# Patient Record
Sex: Female | Born: 1935 | Race: Black or African American | Hispanic: No | Marital: Married | Smoking: Never smoker
Health system: Southern US, Community
[De-identification: ages and names within clinical notes are randomized; demographics above are authoritative.]

## PROBLEM LIST (undated history)

## (undated) DIAGNOSIS — I150 Renovascular hypertension: Secondary | ICD-10-CM

## (undated) DIAGNOSIS — J309 Allergic rhinitis, unspecified: Secondary | ICD-10-CM

## (undated) DIAGNOSIS — F0280 Dementia in other diseases classified elsewhere without behavioral disturbance: Secondary | ICD-10-CM

## (undated) DIAGNOSIS — G309 Alzheimer's disease, unspecified: Secondary | ICD-10-CM

## (undated) DIAGNOSIS — N189 Chronic kidney disease, unspecified: Secondary | ICD-10-CM

## (undated) HISTORY — DX: Allergic rhinitis, unspecified: J30.9

## (undated) HISTORY — DX: Dementia in other diseases classified elsewhere without behavioral disturbance: F02.80

## (undated) HISTORY — DX: Alzheimer's disease, unspecified: G30.9

## (undated) HISTORY — DX: Chronic kidney disease, unspecified: N18.9

## (undated) HISTORY — DX: Renovascular hypertension: I15.0

---

## 1999-05-18 ENCOUNTER — Emergency Department (HOSPITAL_COMMUNITY): Admission: EM | Admit: 1999-05-18 | Discharge: 1999-05-18 | Payer: Self-pay | Admitting: Emergency Medicine

## 2000-01-12 ENCOUNTER — Other Ambulatory Visit: Admission: RE | Admit: 2000-01-12 | Discharge: 2000-01-12 | Payer: Self-pay | Admitting: Obstetrics

## 2000-01-15 ENCOUNTER — Encounter: Payer: Self-pay | Admitting: Cardiology

## 2000-01-15 ENCOUNTER — Ambulatory Visit (HOSPITAL_COMMUNITY): Admission: RE | Admit: 2000-01-15 | Discharge: 2000-01-15 | Payer: Self-pay | Admitting: Obstetrics

## 2012-09-02 ENCOUNTER — Non-Acute Institutional Stay (SKILLED_NURSING_FACILITY): Payer: Medicare Other | Admitting: Internal Medicine

## 2012-09-02 DIAGNOSIS — I10 Essential (primary) hypertension: Secondary | ICD-10-CM

## 2012-09-02 DIAGNOSIS — F0280 Dementia in other diseases classified elsewhere without behavioral disturbance: Secondary | ICD-10-CM

## 2012-09-02 DIAGNOSIS — N189 Chronic kidney disease, unspecified: Secondary | ICD-10-CM

## 2012-09-02 DIAGNOSIS — J309 Allergic rhinitis, unspecified: Secondary | ICD-10-CM

## 2012-09-14 NOTE — Progress Notes (Signed)
Patient ID: Sherry Savage, female   DOB: [birth year], 79 y.o.   MRN: [id number]        PROGRESS NOTE  DATE:   09/02/2012    FACILITY:  [facility]   LEVEL OF CARE: SNF  Routine Visit  CHIEF COMPLAINT:  Manage renal insufficiency, hypertension and dementia.   HISTORY OF PRESENT ILLNESS:  REASSESSMENT OF ONGOING PROBLEM(S):  CHRONIC KIDNEY DISEASE: The patient's chronic kidney disease remains stable.  Patient denies increasing lower extremity swelling or confusion. Last BUN and creatinine are:  On 08/22/2012, patient's BUN was 15, creatinine 1.33.  On 08/18/2012, BUN was 18, creatinine 1.37.    DEMENTIA: The dementia remains stable and continues to function adequately in the current living environment with supervision.  The patient has had little changes in behavior. No complications noted from the medications presently being used.   HTN: Pt 's HTN remains stable.  Denies CP, sob, DOE, pedal edema, headaches, dizziness or visual disturbances.  No complications from the medications currently being used.  Last BP : 136/70, 140/80, 142/80.  PAST MEDICAL HISTORY : Reviewed.  No changes.  CURRENT MEDICATIONS: Reviewed per [facility]  REVIEW OF SYSTEMS:  GENERAL: no change in appetite, no fatigue, no weight changes, no fever, chills or weakness RESPIRATORY: no cough, SOB, DOE, wheezing, hemoptysis CARDIAC: no chest pain, edema or palpitations GI: no abdominal pain, diarrhea, constipation, heart burn, nausea or vomiting  PHYSICAL EXAMINATION  GENERAL: no acute distress, normal body habitus EYES: conjunctivae normal, sclerae normal, normal eye lids NECK: supple, trachea midline, no neck masses, no thyroid tenderness, no thyromegaly LYMPHATICS: no LAN in the neck, no supraclavicular LAN RESPIRATORY: breathing is even & unlabored, BS CTAB CARDIAC: RRR, no murmur,no extra heart sounds, no edema GI: abdomen soft, normal BS, no masses, no tenderness, no hepatomegaly, no  splenomegaly PSYCHIATRIC: the patient is alert & oriented to person, affect & behavior appropriate  LABS/RADIOLOGY: 07/2012:  Creatinine 1.37, otherwise CMP normal.   06/2012:  CBC normal.  ASSESSMENT/PLAN:  Renal insufficiency.  Renal functions improved.   Alzheimer's dementia.  Stable.   Hypertension.  Stable.   Allergic rhinitis.  Well controlled.   CPT CODE: 82956

## 2012-09-30 ENCOUNTER — Non-Acute Institutional Stay (SKILLED_NURSING_FACILITY): Payer: Medicare Other | Admitting: Internal Medicine

## 2012-09-30 DIAGNOSIS — N189 Chronic kidney disease, unspecified: Secondary | ICD-10-CM

## 2012-09-30 DIAGNOSIS — I150 Renovascular hypertension: Secondary | ICD-10-CM

## 2012-09-30 DIAGNOSIS — G309 Alzheimer's disease, unspecified: Secondary | ICD-10-CM

## 2012-09-30 DIAGNOSIS — F0280 Dementia in other diseases classified elsewhere without behavioral disturbance: Secondary | ICD-10-CM

## 2012-09-30 DIAGNOSIS — J309 Allergic rhinitis, unspecified: Secondary | ICD-10-CM

## 2012-10-01 NOTE — Progress Notes (Signed)
Patient ID: Sherry Savage, female   DOB: [birth year], 79 y.o.   MRN: [id number]        PROGRESS NOTE  DATE:   09/30/2012    FACILITY:  [facility]   LEVEL OF CARE: SNF  Routine Visit  CHIEF COMPLAINT:  Manage renal insufficiency, hypertension and dementia.   HISTORY OF PRESENT ILLNESS:  REASSESSMENT OF ONGOING PROBLEM(S):  CHRONIC KIDNEY DISEASE: The patient's chronic kidney disease remains stable.  Patient denies increasing lower extremity swelling or confusion. Last BUN and creatinine are:  On 08/22/2012, patient's BUN was 15, creatinine 1.33.  On 08/18/2012, BUN was 18, creatinine 1.37.    DEMENTIA: The dementia remains stable and continues to function adequately in the current living environment with supervision.  The patient has had little changes in behavior. No complications noted from the medications presently being used.   HTN: Pt 's HTN remains stable.  Denies CP, sob, DOE, pedal edema, headaches, dizziness or visual disturbances.  No complications from the medications currently being used.  Last BP :140/86, 136/70, 140/80, 142/80.  PAST MEDICAL HISTORY : Reviewed.  No changes.  CURRENT MEDICATIONS: Reviewed per [facility]  REVIEW OF SYSTEMS:  GENERAL: no change in appetite, no fatigue, no weight changes, no fever, chills or weakness RESPIRATORY: no cough, SOB, DOE, wheezing, hemoptysis CARDIAC: no chest pain, edema or palpitations GI: no abdominal pain, diarrhea, constipation, heart burn, nausea or vomiting  PHYSICAL EXAMINATION  T97.4   P61   RR18   BP140/86    Wt(Lb) 152  GENERAL: no acute distress, normal body habitus NECK: supple, trachea midline, no neck masses, no thyroid tenderness, no thyromegaly RESPIRATORY: breathing is even & unlabored, BS CTAB CARDIAC: RRR, no murmur,no extra heart sounds, no edema GI: abdomen soft, normal BS, no masses, no tenderness, no hepatomegaly, no splenomegaly PSYCHIATRIC: the patient is alert & oriented to person, affect &  behavior appropriate  LABS/RADIOLOGY: 07/2012:  Creatinine 1.37, otherwise CMP normal.   06/2012:  CBC normal.  ASSESSMENT/PLAN:  Renal insufficiency.  Renal functions improved.   Alzheimer's dementia.  Stable.   Hypertension.  Stable.   Allergic rhinitis.  Well controlled.   CPT CODE: 40981

## 2012-11-02 ENCOUNTER — Non-Acute Institutional Stay (SKILLED_NURSING_FACILITY): Payer: Medicare Other | Admitting: Internal Medicine

## 2012-11-02 DIAGNOSIS — F0280 Dementia in other diseases classified elsewhere without behavioral disturbance: Secondary | ICD-10-CM

## 2012-11-02 DIAGNOSIS — I150 Renovascular hypertension: Secondary | ICD-10-CM

## 2012-11-02 DIAGNOSIS — N189 Chronic kidney disease, unspecified: Secondary | ICD-10-CM | POA: Insufficient documentation

## 2012-11-02 DIAGNOSIS — J309 Allergic rhinitis, unspecified: Secondary | ICD-10-CM

## 2012-11-02 DIAGNOSIS — G309 Alzheimer's disease, unspecified: Secondary | ICD-10-CM | POA: Insufficient documentation

## 2012-11-02 HISTORY — DX: Renovascular hypertension: I15.0

## 2012-11-02 HISTORY — DX: Allergic rhinitis, unspecified: J30.9

## 2012-11-02 HISTORY — DX: Chronic kidney disease, unspecified: N18.9

## 2012-11-02 HISTORY — DX: Dementia in other diseases classified elsewhere, unspecified severity, without behavioral disturbance, psychotic disturbance, mood disturbance, and anxiety: F02.80

## 2012-11-02 NOTE — Progress Notes (Signed)
Patient ID: Sherry Savage, female   DOB: 06-13-1915, 79 y.o.   MRN: [id number]        PROGRESS NOTE  DATE:   10/31/2012    FACILITY:  [facility]   LEVEL OF CARE: SNF  Routine Visit  CHIEF COMPLAINT:  Manage renal insufficiency, hypertension and dementia.   HISTORY OF PRESENT ILLNESS:  REASSESSMENT OF ONGOING PROBLEM(S):  CHRONIC KIDNEY DISEASE: The patient's chronic kidney disease remains stable.  Patient denies increasing lower extremity swelling or confusion. Last BUN and creatinine are:  On 08/22/2012, patient's BUN was 15, creatinine 1.33.  On 08/18/2012, BUN was 18, creatinine 1.37.    DEMENTIA: The dementia remains stable and continues to function adequately in the current living environment with supervision.  The patient has had little changes in behavior. No complications noted from the medications presently being used.   HTN: Pt 's HTN remains stable.  Denies CP, sob, DOE, pedal edema, headaches, dizziness or visual disturbances.  No complications from the medications currently being used.  Last BP :142/84,140/86, 136/70, 140/80, 142/80.  PAST MEDICAL HISTORY : Reviewed.  No changes.  CURRENT MEDICATIONS: Reviewed per [facility]  REVIEW OF SYSTEMS:  GENERAL: no change in appetite, no fatigue, no weight changes, no fever, chills or weakness RESPIRATORY: no cough, SOB, DOE, wheezing, hemoptysis CARDIAC: no chest pain, edema or palpitations GI: no abdominal pain, diarrhea, constipation, heart burn, nausea or vomiting  PHYSICAL EXAMINATION  T96  P68  RR18   BP140/86    Wt(Lb) 154  GENERAL: no acute distress, normal body habitus NECK: supple, trachea midline, no neck masses, no thyroid tenderness, no thyromegaly RESPIRATORY: breathing is even & unlabored, BS CTAB CARDIAC: RRR, no murmur,no extra heart sounds, no edema GI: abdomen soft, normal BS, no masses, no tenderness, no hepatomegaly, no splenomegaly PSYCHIATRIC: the patient is alert & oriented to person, affect &  behavior appropriate  LABS/RADIOLOGY: 5/14 liver profile normal 07/2012:  Creatinine 1.37, otherwise CMP normal.   06/2012:  CBC normal.  ASSESSMENT/PLAN:  Renal insufficiency.  Renal functions improved.   Alzheimer's dementia.  Stable.   Hypertension.  Stable.   Allergic rhinitis.  Well controlled.   CPT CODE: 40981

## 2012-12-08 ENCOUNTER — Non-Acute Institutional Stay (SKILLED_NURSING_FACILITY): Payer: Medicare Other | Admitting: Adult Health

## 2012-12-08 DIAGNOSIS — G309 Alzheimer's disease, unspecified: Secondary | ICD-10-CM

## 2012-12-08 DIAGNOSIS — F0280 Dementia in other diseases classified elsewhere without behavioral disturbance: Secondary | ICD-10-CM

## 2012-12-08 DIAGNOSIS — J309 Allergic rhinitis, unspecified: Secondary | ICD-10-CM

## 2012-12-08 DIAGNOSIS — I150 Renovascular hypertension: Secondary | ICD-10-CM

## 2013-01-04 ENCOUNTER — Non-Acute Institutional Stay (SKILLED_NURSING_FACILITY): Payer: Medicare Other | Admitting: Internal Medicine

## 2013-01-04 DIAGNOSIS — N189 Chronic kidney disease, unspecified: Secondary | ICD-10-CM

## 2013-01-04 DIAGNOSIS — F0280 Dementia in other diseases classified elsewhere without behavioral disturbance: Secondary | ICD-10-CM

## 2013-01-04 DIAGNOSIS — G309 Alzheimer's disease, unspecified: Secondary | ICD-10-CM

## 2013-01-04 DIAGNOSIS — I150 Renovascular hypertension: Secondary | ICD-10-CM

## 2013-01-04 DIAGNOSIS — J309 Allergic rhinitis, unspecified: Secondary | ICD-10-CM

## 2013-01-04 NOTE — Progress Notes (Signed)
Patient ID: Sherry Savage, female   DOB: 25-Dec-1915, 79 y.o.   MRN: [id number]        PROGRESS NOTE  DATE:   11/02/2012    FACILITY:  [facility]   LEVEL OF CARE: SNF  Routine Visit  CHIEF COMPLAINT:  Manage renal insufficiency, hypertension and dementia.   HISTORY OF PRESENT ILLNESS:  REASSESSMENT OF ONGOING PROBLEM(S):  CHRONIC KIDNEY DISEASE: The patient's chronic kidney disease remains stable.  Patient denies increasing lower extremity swelling or confusion. Last BUN and creatinine are:  On 08/22/2012, patient's BUN was 15, creatinine 1.33.  On 08/18/2012, BUN was 18, creatinine 1.37.    DEMENTIA: The dementia remains stable and continues to function adequately in the current living environment with supervision.  The patient has had little changes in behavior. No complications noted from the medications presently being used.   HTN: Pt 's HTN remains stable.  Denies CP, sob, DOE, pedal edema, headaches, dizziness or visual disturbances.  No complications from the medications currently being used.  Last BP :142/84,140/86, 136/70, 140/80, 142/80, 138/76  PAST MEDICAL HISTORY : Reviewed.  No changes.  CURRENT MEDICATIONS: Reviewed per [facility]  REVIEW OF SYSTEMS:  GENERAL: no change in appetite, no fatigue, no weight changes, no fever, chills or weakness RESPIRATORY: no cough, SOB, DOE, wheezing, hemoptysis CARDIAC: no chest pain, edema or palpitations GI: no abdominal pain, diarrhea, constipation, heart burn, nausea or vomiting  PHYSICAL EXAMINATION  T 98   P68   RR18   BP138/76    Wt(Lb) 158  GENERAL: no acute distress, normal body habitus NECK: supple, trachea midline, no neck masses, no thyroid tenderness, no thyromegaly RESPIRATORY: breathing is even & unlabored, BS CTAB CARDIAC: RRR, no murmur,no extra heart sounds, no edema GI: abdomen soft, normal BS, no masses, no tenderness, no hepatomegaly, no splenomegaly PSYCHIATRIC: the patient is alert & oriented to person,  affect & behavior appropriate  LABS/RADIOLOGY:  7/14 cbc nl 5/14 liver profile normal 07/2012:  Creatinine 1.37, otherwise CMP normal.   06/2012:  CBC normal.  ASSESSMENT/PLAN:  Renal insufficiency.  Renal functions improved.   Alzheimer's dementia.  Stable.   Hypertension.  Stable.   Allergic rhinitis.  Well controlled.   CPT CODE: 40981

## 2013-02-03 ENCOUNTER — Non-Acute Institutional Stay (SKILLED_NURSING_FACILITY): Payer: Medicare Other | Admitting: Internal Medicine

## 2013-02-03 DIAGNOSIS — J309 Allergic rhinitis, unspecified: Secondary | ICD-10-CM

## 2013-02-03 DIAGNOSIS — N189 Chronic kidney disease, unspecified: Secondary | ICD-10-CM

## 2013-02-03 DIAGNOSIS — F0280 Dementia in other diseases classified elsewhere without behavioral disturbance: Secondary | ICD-10-CM

## 2013-02-03 DIAGNOSIS — I150 Renovascular hypertension: Secondary | ICD-10-CM

## 2013-02-05 NOTE — Progress Notes (Signed)
Patient ID: Sherry Savage, female   DOB: 11-Jul-1915, 79 y.o.   MRN: [id number]        PROGRESS NOTE  DATE:   02/03/2013    FACILITY:  [facility]   LEVEL OF CARE: SNF  Routine Visit  CHIEF COMPLAINT:  Manage renal insufficiency, hypertension and dementia.   HISTORY OF PRESENT ILLNESS:  REASSESSMENT OF ONGOING PROBLEM(S):  CHRONIC KIDNEY DISEASE: The patient's chronic kidney disease remains stable.  Patient denies increasing lower extremity swelling or confusion. Last BUN and creatinine are:  On 08/22/2012, patient's BUN was 15, creatinine 1.33.  On 08/18/2012, BUN was 18, creatinine 1.37.    DEMENTIA: The dementia remains stable and continues to function adequately in the current living environment with supervision.  The patient has had little changes in behavior. No complications noted from the medications presently being used.   HTN: Pt 's HTN remains stable.  Denies CP, sob, DOE, pedal edema, headaches, dizziness or visual disturbances.  No complications from the medications currently being used.  Last BP :142/84,140/86, 136/70, 140/80, 142/80, 138/76, 120/68  PAST MEDICAL HISTORY : Reviewed.  No changes.  CURRENT MEDICATIONS: Reviewed per [facility]  REVIEW OF SYSTEMS:  GENERAL: no change in appetite, no fatigue, no weight changes, no fever, chills or weakness RESPIRATORY: no cough, SOB, DOE, wheezing, hemoptysis CARDIAC: no chest pain, edema or palpitations GI: no abdominal pain, diarrhea, constipation, heart burn, nausea or vomiting  PHYSICAL EXAMINATION  T 99   P82   RR20   BP120/68    Wt(Lb) 152  GENERAL: no acute distress, normal body habitus NECK: supple, trachea midline, no neck masses, no thyroid tenderness, no thyromegaly RESPIRATORY: breathing is even & unlabored, BS CTAB CARDIAC: RRR, no murmur,no extra heart sounds, no edema GI: abdomen soft, normal BS, no masses, no tenderness, no hepatomegaly, no splenomegaly PSYCHIATRIC: the patient is alert & oriented to  person, affect & behavior appropriate  LABS/RADIOLOGY:  7/14 cbc nl 5/14 liver profile normal 07/2012:  Creatinine 1.37, otherwise CMP normal.   06/2012:  CBC normal.  ASSESSMENT/PLAN:  Renal insufficiency.  Renal functions improved. Recheck  Alzheimer's dementia.  Stable.   Hypertension.  Stable.   Allergic rhinitis.  Well controlled.   Check CMP  CPT CODE: 78295

## 2013-02-10 ENCOUNTER — Non-Acute Institutional Stay (SKILLED_NURSING_FACILITY): Payer: Medicare Other | Admitting: Internal Medicine

## 2013-02-10 DIAGNOSIS — N189 Chronic kidney disease, unspecified: Secondary | ICD-10-CM

## 2013-02-11 NOTE — Progress Notes (Signed)
PROGRESS NOTE  DATE: 02/10/2013  FACILITY:  [facility]  LEVEL OF CARE: SNF (31)  Acute Visit  CHIEF COMPLAINT:  Manage CKD  HISTORY OF PRESENT ILLNESS: I was requested by the staff to assess the patient regarding above problem(s):  CHRONIC KIDNEY DISEASE: The patient's chronic kidney disease remains stable.  Patient denies increasing lower extremity swelling or confusion. Last BUN and creatinine are: 19/1.28 on 02/08/13.  In 3/14 Cr 1.33.  PAST MEDICAL HISTORY : Reviewed.  No changes.  CURRENT MEDICATIONS: Reviewed per [facility]  PHYSICAL EXAMINATION  GENERAL: no acute distress, normal body habitus RESPIRATORY: breathing is even & unlabored, BS CTAB CARDIAC: RRR, no murmur,no extra heart sounds, no edema  LABS/RADIOLOGY: see HPI  ASSESSMENT/PLAN:  CKD- renal fcns improved.  CPT CODE: 16109

## 2013-03-09 ENCOUNTER — Non-Acute Institutional Stay (SKILLED_NURSING_FACILITY): Payer: Medicare Other | Admitting: Internal Medicine

## 2013-03-09 DIAGNOSIS — N189 Chronic kidney disease, unspecified: Secondary | ICD-10-CM

## 2013-03-09 DIAGNOSIS — F0280 Dementia in other diseases classified elsewhere without behavioral disturbance: Secondary | ICD-10-CM

## 2013-03-09 DIAGNOSIS — I150 Renovascular hypertension: Secondary | ICD-10-CM

## 2013-03-09 DIAGNOSIS — J309 Allergic rhinitis, unspecified: Secondary | ICD-10-CM

## 2013-03-09 NOTE — Progress Notes (Signed)
Patient ID: Sherry Savage, female   DOB: August 07, 1915, 79 y.o.   MRN: [id number]        PROGRESS NOTE  DATE:   03/09/2013    [facility]ILITY[facility]  [facility]   LEVEL OF CARE: SNF  Routine Visit  CHIEF COMPLAINT:  Manage renal insufficiency, hypertension and dementia.   HISTORY OF PRESENT ILLNESS:  REASSESSMENT OF ONGOING PROBLEM(S):  CHRONIC KIDNEY DISEASE: The patient's chronic kidney disease remains stable.  Patient denies increasing lower extremity swelling or confusion. Last BUN and creatinine are:  On 08/22/2012, patient's BUN was 15, creatinine 1.33.  On 08/18/2012, BUN was 18, creatinine 1.37, in 9/14 bun 19, cr 1.28.    DEMENTIA: The dementia remains stable and continues to function adequately in the current living environment with supervision.  The patient has had little changes in behavior. No complications noted from the medications presently being used.   HTN: Pt 's HTN remains stable.  Denies CP, sob, DOE, pedal edema, headaches, dizziness or visual disturbances.  No complications from the medications currently being used.  Last BP :142/84,140/86, 136/70, 140/80, 142/80, 138/76, 120/68, 122/62  PAST MEDICAL HISTORY : Reviewed.  No changes.  CURRENT MEDICATIONS: Reviewed per [facility]  REVIEW OF SYSTEMS:  GENERAL: no change in appetite, no fatigue, no weight changes, no fever, chills or weakness RESPIRATORY: no cough, SOB, DOE, wheezing, hemoptysis CARDIAC: no chest pain, edema or palpitations GI: no abdominal pain, diarrhea, constipation, heart burn, nausea or vomiting  PHYSICAL EXAMINATION  T 97.8   P72   RR18   BP122/62   Wt(Lb) 152  GENERAL: no acute distress, normal body habitus NECK: supple, trachea midline, no neck masses, no thyroid tenderness, no thyromegaly RESPIRATORY: breathing is even & unlabored, BS CTAB CARDIAC: RRR, no murmur,no extra heart sounds, no edema GI: abdomen soft, normal BS, no masses, no tenderness, no hepatomegaly, no splenomegaly PSYCHIATRIC: the  patient is alert & oriented to person, affect & behavior appropriate  LABS/RADIOLOGY:  9/14 cr 1.28 ow cmp nl  7/14 cbc nl 5/14 liver profile normal 07/2012:  Creatinine 1.37, otherwise CMP normal.   06/2012:  CBC normal.  ASSESSMENT/PLAN:  Renal insufficiency.  Renal functions improved.   Alzheimer's dementia.  Stable.   Hypertension.  Stable.   Allergic rhinitis.  Well controlled.   CPT CODE: 01027

## 2013-04-06 ENCOUNTER — Non-Acute Institutional Stay (SKILLED_NURSING_FACILITY): Payer: Medicare Other | Admitting: Internal Medicine

## 2013-04-06 DIAGNOSIS — J309 Allergic rhinitis, unspecified: Secondary | ICD-10-CM

## 2013-04-06 DIAGNOSIS — F0280 Dementia in other diseases classified elsewhere without behavioral disturbance: Secondary | ICD-10-CM

## 2013-04-06 DIAGNOSIS — I150 Renovascular hypertension: Secondary | ICD-10-CM

## 2013-04-06 DIAGNOSIS — N189 Chronic kidney disease, unspecified: Secondary | ICD-10-CM

## 2013-04-07 NOTE — Progress Notes (Signed)
Patient ID: Sherry Savage, female   DOB: [birth year], 79 y.o.   MRN: [id number]        PROGRESS NOTE  DATE:   04/06/2013    [facility]ACILITY[facility]  [facility]   LEVEL OF CARE: SNF  Routine Visit  CHIEF COMPLAINT:  Manage renal insufficiency, hypertension and dementia.   HISTORY OF PRESENT ILLNESS:  REASSESSMENT OF ONGOING PROBLEM(S):  CHRONIC KIDNEY DISEASE: The patient's chronic kidney disease remains stable.  Patient denies increasing lower extremity swelling or confusion. Last BUN and creatinine are:  On 08/22/2012, patient's BUN was 15, creatinine 1.33.  On 08/18/2012, BUN was 18, creatinine 1.37, in 9/14 bun 19, cr 1.28.    DEMENTIA: The dementia remains stable and continues to function adequately in the current living environment with supervision.  The patient has had little changes in behavior. No complications noted from the medications presently being used.   HTN: Pt 's HTN remains stable.  Denies CP, sob, DOE, pedal edema, headaches, dizziness or visual disturbances.  No complications from the medications currently being used.  Last BP :142/84,140/86, 136/70, 140/80, 142/80, 138/76, 120/68, 122/62, 124/64  PAST MEDICAL HISTORY : Reviewed.  No changes.  CURRENT MEDICATIONS: Reviewed per [facility]  REVIEW OF SYSTEMS:  GENERAL: no change in appetite, no fatigue, no weight changes, no fever, chills or weakness RESPIRATORY: no cough, SOB, DOE, wheezing, hemoptysis CARDIAC: no chest pain, edema or palpitations GI: no abdominal pain, diarrhea, constipation, heart burn, nausea or vomiting  PHYSICAL EXAMINATION  T 97.6   P74   RR18   BP124/64   Wt(Lb) 152  GENERAL: no acute distress, normal body habitus NECK: supple, trachea midline, no neck masses, no thyroid tenderness, no thyromegaly RESPIRATORY: breathing is even & unlabored, BS CTAB CARDIAC: RRR, no murmur,no extra heart sounds, no edema GI: abdomen soft, normal BS, no masses, no tenderness, no hepatomegaly, no  splenomegaly PSYCHIATRIC: the patient is alert & oriented to person, affect & behavior appropriate  LABS/RADIOLOGY:  9/14 cr 1.28 ow cmp nl  7/14 cbc nl 5/14 liver profile normal 07/2012:  Creatinine 1.37, otherwise CMP normal.   06/2012:  CBC normal.  ASSESSMENT/PLAN:  Renal insufficiency.  Renal functions improved.   Alzheimer's dementia.  Stable.   Hypertension.  Stable.   Allergic rhinitis.  Well controlled.   CPT CODE: 40981

## 2013-04-19 ENCOUNTER — Other Ambulatory Visit: Payer: Self-pay | Admitting: *Deleted

## 2013-04-19 MED ORDER — LORAZEPAM 0.5 MG PO TABS: ORAL_TABLET | ORAL | Status: DC

## 2013-05-04 ENCOUNTER — Non-Acute Institutional Stay (SKILLED_NURSING_FACILITY): Payer: Medicare Other | Admitting: Internal Medicine

## 2013-05-04 DIAGNOSIS — N189 Chronic kidney disease, unspecified: Secondary | ICD-10-CM

## 2013-05-04 DIAGNOSIS — J309 Allergic rhinitis, unspecified: Secondary | ICD-10-CM

## 2013-05-04 DIAGNOSIS — F0280 Dementia in other diseases classified elsewhere without behavioral disturbance: Secondary | ICD-10-CM

## 2013-05-04 DIAGNOSIS — I150 Renovascular hypertension: Secondary | ICD-10-CM

## 2013-05-05 ENCOUNTER — Encounter: Payer: Self-pay | Admitting: Internal Medicine

## 2013-05-05 NOTE — Progress Notes (Signed)
Patient ID: Sherry Savage, female   DOB: 10-19-1915, 79 y.o.   MRN: [id number]        PROGRESS NOTE  DATE:   05/04/2013    FACILITY[facility]  [facility]   LEVEL OF CARE: SNF  Routine Visit  CHIEF COMPLAINT:  Manage renal insufficiency, hypertension and dementia.   HISTORY OF PRESENT ILLNESS:  REASSESSMENT OF ONGOING PROBLEM(S):  CHRONIC KIDNEY DISEASE: The patient's chronic kidney disease remains stable.  Patient denies increasing lower extremity swelling or confusion. Last BUN and creatinine are:  On 08/22/2012, patient's BUN was 15, creatinine 1.33.  On 08/18/2012, BUN was 18, creatinine 1.37, in 9/14 bun 19, cr 1.28.    DEMENTIA: The dementia remains stable and continues to function adequately in the current living environment with supervision.  The patient has had little changes in behavior. No complications noted from the medications presently being used.   HTN: Pt 's HTN remains stable.  Denies CP, sob, DOE, pedal edema, headaches, dizziness or visual disturbances.  No complications from the medications currently being used.  Last BP :142/84,140/86, 136/70, 140/80, 142/80, 138/76, 120/68, 122/62, 124/64  PAST MEDICAL HISTORY : Reviewed.  No changes.  CURRENT MEDICATIONS: Reviewed per [facility]  REVIEW OF SYSTEMS:  GENERAL: no change in appetite, no fatigue, no weight changes, no fever, chills or weakness RESPIRATORY: no cough, SOB, DOE, wheezing, hemoptysis CARDIAC: no chest pain, edema or palpitations GI: no abdominal pain, diarrhea, constipation, heart burn, nausea or vomiting  PHYSICAL EXAMINATION  T 97.6   P74   RR18   BP124/64   Wt(Lb) 152  GENERAL: no acute distress, normal body habitus NECK: supple, trachea midline, no neck masses, no thyroid tenderness, no thyromegaly RESPIRATORY: breathing is even & unlabored, BS CTAB CARDIAC: RRR, no murmur,no extra heart sounds, no edema GI: abdomen soft, normal BS, no masses, no tenderness, no hepatomegaly, no  splenomegaly PSYCHIATRIC: the patient is alert & oriented to person, affect & behavior appropriate  LABS/RADIOLOGY:  9/14 cr 1.28 ow cmp nl  7/14 cbc nl 5/14 liver profile normal 07/2012:  Creatinine 1.37, otherwise CMP normal.   06/2012:  CBC normal.  ASSESSMENT/PLAN:  Renal insufficiency.  Renal functions improved.   Alzheimer's dementia.  Stable.   Hypertension.  Stable.   Allergic rhinitis.  Well controlled.   Anxiety-Ativan was started.  CPT CODE: 40981

## 2013-05-09 ENCOUNTER — Encounter: Payer: Self-pay | Admitting: Adult Health

## 2013-05-09 NOTE — Progress Notes (Signed)
Patient ID: Sherry Savage, female   DOB: 04-07-1916, 79 y.o.   MRN: [id number]     [place]  Allergies no known allergies  Chief Complaint  Patient presents with  . Medical Managment of Chronic Issues    HPI She is being seen for the management of her chronic illnesses. Overall she has not had a significant change in her recent status. There are no concerns being voiced by the nursing staff at this time. She cannot fully participate in the hpi or ros.   Past Medical History  Diagnosis Date  . 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 Alzheimer's disease 11/02/2012  . Allergic rhinitis, cause unspecified 11/02/2012  . Chronic kidney disease, unspecified 11/02/2012    No past surgical history on file.  Filed Vitals:   12/08/12 1308  BP: 132/72  Pulse: 64  Height: 5' (1.524 m)  Weight: 158 lb (71.668 kg)    MEDICATIONS Asa 81 mg daily Cozaar 100 mg daily claritin 10 mg daily aricept 10 mg daily Clonidine 0.1 mg bid   LABS REVIEWED:   08-10-12: glucose 79; bun 18; creat 1.37; k+4.4; na++ 145; liver normal albumin 3.7  10-19-12: liver normal albumin 3.9  Review of Systems  Unable to perform ROS   Physical Exam  Constitutional: She appears well-developed. No distress.  Neck: Neck supple. No JVD present.  Cardiovascular: Normal rate, regular rhythm and intact distal pulses.   Respiratory: Effort normal and breath sounds normal. No respiratory distress.  GI: Soft. Bowel sounds are normal. She exhibits no distension. There is no tenderness.  Musculoskeletal: She exhibits no edema.  Is able to move extremities   Neurological: She is alert.  Skin: Skin is warm and dry. She is not diaphoretic.     ASSESSMENT/PLAN  1. Alzheimer's disease: she is without change in status; will continue her aricept 10 mg daily and will continue to monitor her status.   2. Hypertension: is stable will continue cozaar 100 mg daily and clonidine 0.1 mg twice daily and asa 81  mg daily   3. Allergic rhinitis: will continue claritin 10 mg daily

## 2013-06-08 ENCOUNTER — Non-Acute Institutional Stay (SKILLED_NURSING_FACILITY): Payer: Medicare Other | Admitting: Internal Medicine

## 2013-06-08 DIAGNOSIS — F0280 Dementia in other diseases classified elsewhere without behavioral disturbance: Secondary | ICD-10-CM

## 2013-06-08 DIAGNOSIS — N189 Chronic kidney disease, unspecified: Secondary | ICD-10-CM

## 2013-06-08 DIAGNOSIS — G309 Alzheimer's disease, unspecified: Secondary | ICD-10-CM

## 2013-06-08 DIAGNOSIS — J309 Allergic rhinitis, unspecified: Secondary | ICD-10-CM

## 2013-06-08 DIAGNOSIS — I150 Renovascular hypertension: Secondary | ICD-10-CM

## 2013-06-09 NOTE — Progress Notes (Signed)
Patient ID: Sherry LeschesHattie Savage, female   DOB: Feb 06, 1916, [age] y.o.   MRN: [id number]          PROGRESS NOTE  DATE: 06-08-13    FACILITY: [facility]   LEVEL OF CARE: SNF  Routine Visit  CHIEF COMPLAINT:  Manage renal insufficiency, hypertension and dementia.   HISTORY OF PRESENT ILLNESS:  REASSESSMENT OF ONGOING PROBLEM(S):  CHRONIC KIDNEY DISEASE: The patient's chronic kidney disease remains stable.  Patient denies increasing lower extremity swelling or confusion. Last BUN and creatinine are:  On 08/22/2012, patient's BUN was 15, creatinine 1.33.  On 08/18/2012, BUN was 18, creatinine 1.37, in 9/14 bun 19, cr 1.28.    DEMENTIA: The dementia remains stable and continues to function adequately in the current living environment with supervision.  The patient has had little changes in behavior. No complications noted from the medications presently being used.   HTN: Pt 's HTN remains stable.  Denies CP, sob, DOE, pedal edema, headaches, dizziness or visual disturbances.  No complications from the medications currently being used.  Last BP :142/84,140/86, 136/70, 140/80, 142/80, 138/76, 120/68, 122/62, 124/64, 110/70  PAST MEDICAL HISTORY : Reviewed.  No changes.  [facility]  REVIEW OF SYSTEMS:  GENERAL: no change in appetite, no fatigue, no weight changes, no fever, chills or weakness RESPIRATORY: no cough, SOB, DOE, wheezing, hemoptysis CARDIAC: no chest pain, edema or palpitations GI: no abdominal pain, diarrhea, constipation, heart burn, nausea or vomiting  PHYSICAL EXAMINATION  T 97.2     P68      RR 20      BP 110/70    Wt(Lb) 155  GENERAL: no acute distress, normal body habitus NECK: supple, trachea midline, no neck masses, no thyroid tenderness, no thyromegaly RESPIRATORY: breathing is even & unlabored, BS CTAB CARDIAC: RRR, no murmur,no extra heart sounds, no edema GI: abdomen soft, normal BS, no masses, no tenderness, no hepatomegaly, no  splenomegaly PSYCHIATRIC: the patient is alert & oriented to person, affect & behavior appropriate  LABS/RADIOLOGY: 11-14 liver profile normal  9/14 cr 1.28 ow cmp nl  7/14 cbc nl 5/14 liver profile normal 07/2012:  Creatinine 1.37, otherwise CMP normal.   06/2012:  CBC normal.  ASSESSMENT/PLAN:  Renal insufficiency.  Renal functions improved.   Alzheimer's dementia.  Stable.   Hypertension.  Stable.   Allergic rhinitis.  Well controlled.   Anxiety-Ativan was started.  Check CBC  CPT CODE: [id number]

## 2013-07-06 ENCOUNTER — Non-Acute Institutional Stay (SKILLED_NURSING_FACILITY): Payer: Medicare Other | Admitting: Internal Medicine

## 2013-07-06 DIAGNOSIS — F0280 Dementia in other diseases classified elsewhere without behavioral disturbance: Secondary | ICD-10-CM

## 2013-07-06 DIAGNOSIS — J309 Allergic rhinitis, unspecified: Secondary | ICD-10-CM

## 2013-07-06 DIAGNOSIS — N189 Chronic kidney disease, unspecified: Secondary | ICD-10-CM

## 2013-07-06 DIAGNOSIS — G309 Alzheimer's disease, unspecified: Secondary | ICD-10-CM

## 2013-07-06 DIAGNOSIS — I150 Renovascular hypertension: Secondary | ICD-10-CM

## 2013-07-07 NOTE — Progress Notes (Signed)
Patient ID: Sherry LeschesHattie Spangle, female   DOB: June 29, 1915, 80 y.o.   MRN: [id number]           PROGRESS NOTE  DATE: 07-06-13    FACILITY: [facility]   LEVEL OF CARE: SNF  Routine Visit  CHIEF COMPLAINT:  Manage renal insufficiency, hypertension and dementia.   HISTORY OF PRESENT ILLNESS:  REASSESSMENT OF ONGOING PROBLEM(S):  CHRONIC KIDNEY DISEASE: The patient's chronic kidney disease remains stable.  Patient denies increasing lower extremity swelling or confusion. Last BUN and creatinine are:  On 08/22/2012, patient's BUN was 15, creatinine 1.33.  On 08/18/2012, BUN was 18, creatinine 1.37, in 9/14 bun 19, cr 1.28.    DEMENTIA: The dementia remains stable and continues to function adequately in the current living environment with supervision.  The patient has had little changes in behavior. No complications noted from the medications presently being used.   HTN: Pt 's HTN remains stable.  Denies CP, sob, DOE, pedal edema, headaches, dizziness or visual disturbances.  No complications from the medications currently being used.  Last BP :142/84,140/86, 136/70, 140/80, 142/80, 138/76, 120/68, 122/62, 124/64, 110/70, 125/87  PAST MEDICAL HISTORY : Reviewed.  No changes.  CURRENT MEDICATIONS: Reviewed per [facility]  REVIEW OF SYSTEMS:  GENERAL: no change in appetite, no fatigue, no weight changes, no fever, chills or weakness RESPIRATORY: no cough, SOB, DOE, wheezing, hemoptysis CARDIAC: no chest pain, edema or palpitations GI: no abdominal pain, diarrhea, constipation, heart burn, nausea or vomiting  PHYSICAL EXAMINATION  T 97.8     P 76      RR 20      BP 125/87    Wt(Lb) 155  GENERAL: no acute distress, normal body habitus NECK: supple, trachea midline, no neck masses, no thyroid tenderness, no thyromegaly RESPIRATORY: breathing is even & unlabored, BS CTAB CARDIAC: RRR, no murmur,no extra heart sounds, no edema GI: abdomen soft, normal BS, no masses, no tenderness, no hepatomegaly,  no splenomegaly PSYCHIATRIC: the patient is alert & oriented to person, affect & behavior appropriate  LABS/RADIOLOGY: 1-15 CBC normal 11-14 liver profile normal  9/14 cr 1.28 ow cmp nl  7/14 cbc nl 5/14 liver profile normal 07/2012:  Creatinine 1.37, otherwise CMP normal.   06/2012:  CBC normal.  ASSESSMENT/PLAN:  Renal insufficiency.  Renal functions improved.   Alzheimer's dementia.  Stable.   Hypertension.  Stable.   Allergic rhinitis.  Well controlled.   Anxiety-Ativan was started.  CPT CODE: 0272599308

## 2013-09-13 ENCOUNTER — Other Ambulatory Visit: Payer: Self-pay | Admitting: *Deleted

## 2013-09-13 MED ORDER — LORAZEPAM 0.5 MG PO TABS: ORAL_TABLET | ORAL | Status: DC

## 2013-09-13 NOTE — Telephone Encounter (Signed)
[facility] 

## 2013-10-23 ENCOUNTER — Non-Acute Institutional Stay (SKILLED_NURSING_FACILITY): Payer: Medicare Other | Admitting: Internal Medicine

## 2013-10-23 DIAGNOSIS — N189 Chronic kidney disease, unspecified: Secondary | ICD-10-CM

## 2013-10-23 DIAGNOSIS — I150 Renovascular hypertension: Secondary | ICD-10-CM

## 2013-10-23 DIAGNOSIS — J309 Allergic rhinitis, unspecified: Secondary | ICD-10-CM

## 2013-10-23 DIAGNOSIS — G309 Alzheimer's disease, unspecified: Principal | ICD-10-CM

## 2013-10-23 DIAGNOSIS — F0280 Dementia in other diseases classified elsewhere without behavioral disturbance: Secondary | ICD-10-CM

## 2013-10-23 NOTE — Progress Notes (Signed)
Patient ID: Sherry Savage, female   DOB: 1915/11/18, 80 y.o.   MRN: [id number]            PROGRESS NOTE  DATE: 10-23-13    FACILITY: [facility]   LEVEL OF CARE: SNF  Routine Visit  CHIEF COMPLAINT:  Manage renal insufficiency, hypertension and dementia.   HISTORY OF PRESENT ILLNESS:  REASSESSMENT OF ONGOING PROBLEM(S):  CHRONIC KIDNEY DISEASE: The patient's chronic kidney disease remains stable.  Patient denies increasing lower extremity swelling or confusion. Last BUN and creatinine are:  On 08/22/2012, patient's BUN was 15, creatinine 1.33.  On 08/18/2012, BUN was 18, creatinine 1.37, in 9/14 bun 19, cr 1.28, in 3-15 bun 15, cr 1.34.Marland Kitchen    DEMENTIA: The dementia remains stable and continues to function adequately in the current living environment with supervision.  The patient has had little changes in behavior. No complications noted from the medications presently being used.   HTN: Pt 's HTN remains stable.  Denies CP, sob, DOE, pedal edema, headaches, dizziness or visual disturbances.  No complications from the medications currently being used.  Last BP :142/84,140/86, 136/70, 140/80, 142/80, 138/76, 120/68, 122/62, 124/64, 110/70, 125/87, 115/60.  PAST MEDICAL HISTORY : Reviewed.  No changes.  CURRENT MEDICATIONS: Reviewed per [facility]  REVIEW OF SYSTEMS:  GENERAL: no change in appetite, no fatigue, no weight changes, no fever, chills or weakness RESPIRATORY: no cough, SOB, DOE, wheezing, hemoptysis CARDIAC: no chest pain, edema or palpitations GI: no abdominal pain, diarrhea, constipation, heart burn, nausea or vomiting  PHYSICAL EXAMINATION  VS: see VS section  GENERAL: no acute distress, normal body habitus EYES: Normal sclerae, normal conjunctivae, no discharge NECK: supple, trachea midline, no neck masses, no thyroid tenderness, no thyromegaly LYMPHATICS: no cervical LAN, no supraclavicular LAN RESPIRATORY: breathing is even & unlabored, BS CTAB CARDIAC: RRR, no  murmur,no extra heart sounds, no edema GI: abdomen soft, normal BS, no masses, no tenderness, no hepatomegaly, no splenomegaly PSYCHIATRIC: the patient is alert & oriented to person, affect & behavior appropriate  LABS/RADIOLOGY:  3-15 cr 1.34, K 5.7 ow cmp nl 1-15 CBC normal 11-14 liver profile normal  9/14 cr 1.28 ow cmp nl  7/14 cbc nl 5/14 liver profile normal 07/2012:  Creatinine 1.37, otherwise CMP normal.   06/2012:  CBC normal.  ASSESSMENT/PLAN:  Renal insufficiency. Stable.  Alzheimer's dementia.  Stable.   Hypertension.  Stable.   Allergic rhinitis.  Well controlled.   Anxiety-stable.  Hyperkalemia-recheck.  CPT CODE: 09983  Newton Pigg. Kerry Dory, MD [facility] [phone]

## 2013-11-13 ENCOUNTER — Non-Acute Institutional Stay (SKILLED_NURSING_FACILITY): Payer: Medicare Other | Admitting: Internal Medicine

## 2013-11-13 DIAGNOSIS — I150 Renovascular hypertension: Secondary | ICD-10-CM

## 2013-11-13 DIAGNOSIS — F0280 Dementia in other diseases classified elsewhere without behavioral disturbance: Secondary | ICD-10-CM

## 2013-11-13 DIAGNOSIS — G309 Alzheimer's disease, unspecified: Secondary | ICD-10-CM

## 2013-11-13 DIAGNOSIS — J309 Allergic rhinitis, unspecified: Secondary | ICD-10-CM

## 2013-11-13 DIAGNOSIS — N189 Chronic kidney disease, unspecified: Secondary | ICD-10-CM

## 2013-11-13 NOTE — Progress Notes (Signed)
Patient ID: Sherry LeschesHattie Tippy, female   DOB: [birth year], 80 y.o.   MRN: [id number]          PROGRESS NOTE  DATE: 11-13-13    FACILITY: [facility]   LEVEL OF CARE: SNF  Routine Visit  CHIEF COMPLAINT:  Manage renal insufficiency, hypertension and dementia.   HISTORY OF PRESENT ILLNESS:  REASSESSMENT OF ONGOING PROBLEM(S):  CHRONIC KIDNEY DISEASE: The patient's chronic kidney disease remains stable.  Patient denies increasing lower extremity swelling or confusion. Last BUN and creatinine are:  On 08/22/2012, patient's BUN was 15, creatinine 1.33.  On 08/18/2012, BUN was 18, creatinine 1.37, in 9/14 bun 19, cr 1.28, in 3-15 bun 15, cr 1.34.Marland Kitchen.    DEMENTIA: The dementia remains stable and continues to function adequately in the current living environment with supervision.  The patient has had little changes in behavior. No complications noted from the medications presently being used.   HTN: Pt 's HTN remains stable.  Denies CP, sob, DOE, pedal edema, headaches, dizziness or visual disturbances.  No complications from the medications currently being used.  Last BP :142/84,140/86, 136/70, 140/80, 142/80, 138/76, 120/68, 122/62, 124/64, 110/70, 125/87, 115/60, 164/81.  PAST MEDICAL HISTORY : Reviewed.  No changes.  CURRENT MEDICATIONS: [facility]  REVIEW OF SYSTEMS:  GENERAL: no change in appetite, no fatigue, no weight changes, no fever, chills or weakness RESPIRATORY: no cough, SOB, DOE, wheezing, hemoptysis CARDIAC: no chest pain, edema or palpitations GI: no abdominal pain, diarrhea, constipation, heart burn, nausea or vomiting  PHYSICAL EXAMINATION  VS: see VS section  GENERAL: no acute distress, normal body habitus NECK: supple, trachea midline, no neck masses, no thyroid tenderness, no thyromegaly RESPIRATORY: breathing is even & unlabored, BS CTAB CARDIAC: RRR, no murmur,no extra heart sounds, no edema GI: abdomen soft, normal BS, no masses, no tenderness, no  hepatomegaly, no splenomegaly PSYCHIATRIC: the patient is alert & oriented to person, affect & behavior appropriate  LABS/RADIOLOGY: 5-15 potassium 4.8 3-15 cr 1.34, K 5.7 ow cmp nl 1-15 CBC normal 11-14 liver profile normal  9/14 cr 1.28 ow cmp nl  7/14 cbc nl 5/14 liver profile normal 07/2012:  Creatinine 1.37, otherwise CMP normal.   06/2012:  CBC normal.  ASSESSMENT/PLAN:  Renal insufficiency. Stable.  Alzheimer's dementia.  Stable.   Hypertension.  Blood pressure elevated. Will review a log.  Allergic rhinitis.  Well controlled.   Anxiety-stable.  CPT CODE: 4098199308  Newton PiggGayani Y. Kerry Doryasanayaka, MD [facility] [phone]

## 2013-12-13 ENCOUNTER — Non-Acute Institutional Stay (SKILLED_NURSING_FACILITY): Payer: Medicare Other | Admitting: Internal Medicine

## 2013-12-13 DIAGNOSIS — I150 Renovascular hypertension: Secondary | ICD-10-CM

## 2013-12-13 DIAGNOSIS — N189 Chronic kidney disease, unspecified: Secondary | ICD-10-CM

## 2013-12-13 DIAGNOSIS — J309 Allergic rhinitis, unspecified: Secondary | ICD-10-CM

## 2013-12-13 DIAGNOSIS — F0280 Dementia in other diseases classified elsewhere without behavioral disturbance: Secondary | ICD-10-CM

## 2013-12-13 DIAGNOSIS — G309 Alzheimer's disease, unspecified: Secondary | ICD-10-CM

## 2013-12-14 NOTE — Progress Notes (Signed)
Patient ID: Sherry LeschesHattie Hilscher, female   DOB: [birth year], 80 y.o.   MRN: [id number]          PROGRESS NOTE  DATE: 12-13-13    FACILITY: [facility]   LEVEL OF CARE: SNF  Routine Visit  CHIEF COMPLAINT:  Manage renal insufficiency, hypertension and dementia.   HISTORY OF PRESENT ILLNESS:  REASSESSMENT OF ONGOING PROBLEM(S):  CHRONIC KIDNEY DISEASE: The patient's chronic kidney disease remains stable.  Patient denies increasing lower extremity swelling or confusion. Last BUN and creatinine are:  On 08/22/2012, patient's BUN was 15, creatinine 1.33.  On 08/18/2012, BUN was 18, creatinine 1.37, in 9/14 bun 19, cr 1.28, in 3-15 bun 15, cr 1.34.Marland Kitchen.    DEMENTIA: The dementia remains stable and continues to function adequately in the current living environment with supervision.  The patient has had little changes in behavior. No complications noted from the medications presently being used.   HTN: Pt 's HTN remains stable.  Denies CP, sob, DOE, pedal edema, headaches, dizziness or visual disturbances.  No complications from the medications currently being used.  Last BP :142/84,140/86, 136/70, 140/80, 142/80, 138/76, 120/68, 122/62, 124/64, 110/70, 125/87, 115/60, 164/81, 115/78.  PAST MEDICAL HISTORY : Reviewed.  No changes.  [facility] MED[facility]S: [facility]  REVIEW OF SYSTEMS:  GENERAL: no change in appetite, no fatigue, no weight changes, no fever, chills or weakness RESPIRATORY: no cough, SOB, DOE, wheezing, hemoptysis CARDIAC: no chest pain, edema or palpitations GI: no abdominal pain, diarrhea, constipation, heart burn, nausea or vomiting  PHYSICAL EXAMINATION  VS: see VS section  GENERAL: no acute distress, normal body habitus NECK: supple, trachea midline, no neck masses, no thyroid tenderness, no thyromegaly RESPIRATORY: breathing is even & unlabored, BS CTAB CARDIAC: RRR, no murmur,no extra heart sounds, no edema GI: abdomen soft, normal BS, no masses, no tenderness, no  hepatomegaly, no splenomegaly PSYCHIATRIC: the patient is alert & oriented to person, affect & behavior appropriate  LABS/RADIOLOGY: 5-15 potassium 4.8 3-15 cr 1.34, K 5.7 ow cmp nl 1-15 CBC normal 11-14 liver profile normal  9/14 cr 1.28 ow cmp nl  7/14 cbc nl 5/14 liver profile normal 07/2012:  Creatinine 1.37, otherwise CMP normal.   06/2012:  CBC normal.  ASSESSMENT/PLAN:  Renal insufficiency. Stable.  Alzheimer's dementia.  Stable.   Hypertension. Well controlled  Allergic rhinitis.  Well controlled.   Anxiety-stable.  Check CBC  CPT CODE: [id number]  Newton PiggGayani Y. Kerry Doryasanayaka, MD [facility] [phone]

## 2014-01-08 ENCOUNTER — Non-Acute Institutional Stay (SKILLED_NURSING_FACILITY): Payer: Medicare Other | Admitting: Internal Medicine

## 2014-01-08 DIAGNOSIS — J309 Allergic rhinitis, unspecified: Secondary | ICD-10-CM

## 2014-01-08 DIAGNOSIS — N189 Chronic kidney disease, unspecified: Secondary | ICD-10-CM

## 2014-01-08 DIAGNOSIS — G309 Alzheimer's disease, unspecified: Secondary | ICD-10-CM

## 2014-01-08 DIAGNOSIS — I150 Renovascular hypertension: Secondary | ICD-10-CM

## 2014-01-08 DIAGNOSIS — F0280 Dementia in other diseases classified elsewhere without behavioral disturbance: Secondary | ICD-10-CM

## 2014-01-09 NOTE — Progress Notes (Signed)
Patient ID: Sherry Savage, female   DOB: 20-Mar-1916, 80 y.o.   MRN: [id number]          PROGRESS NOTE  DATE: 01-08-14    FACILITY: [facility]   LEVEL OF CARE: SNF  Routine Visit  CHIEF COMPLAINT:  Manage renal insufficiency, hypertension and dementia.   HISTORY OF PRESENT ILLNESS:  REASSESSMENT OF ONGOING PROBLEM(S):  CHRONIC KIDNEY DISEASE: The patient's chronic kidney disease remains stable.  Patient denies increasing lower extremity swelling or confusion. Last BUN and creatinine are:  On 08/22/2012, patient's BUN was 15, creatinine 1.33.  On 08/18/2012, BUN was 18, creatinine 1.37, in 9/14 bun 19, cr 1.28, in 3-15 bun 15, cr 1.34.Marland Kitchen.    DEMENTIA: The dementia remains stable and continues to function adequately in the current living environment with supervision.  The patient has had little changes in behavior. No complications noted from the medications presently being used.   HTN: Pt 's HTN remains stable.  Denies CP, sob, DOE, pedal edema, headaches, dizziness or visual disturbances.  No complications from the medications currently being used.  Last BP :142/84,140/86, 136/70, 140/80, 142/80, 138/76, 120/68, 122/62, 124/64, 110/70, 125/87, 115/60, 164/81, 115/78, 128/84.  PAST MEDICAL HISTORY : Reviewed.  No changes.  CURRENT MEDICATIONS: [facility]  REVIEW OF SYSTEMS:  GENERAL: no change in appetite, no fatigue, no weight changes, no fever, chills or weakness RESPIRATORY: no cough, SOB, DOE, wheezing, hemoptysis CARDIAC: no chest pain, edema or palpitations GI: no abdominal pain, diarrhea, constipation, heart burn, nausea or vomiting  PHYSICAL EXAMINATION  VS: see VS section  GENERAL: no acute distress, normal body habitus NECK: supple, trachea midline, no neck masses, no thyroid tenderness, no thyromegaly RESPIRATORY: breathing is even & unlabored, BS CTAB CARDIAC: RRR, no murmur,no extra heart sounds, no edema GI: abdomen soft, normal BS, no masses, no  tenderness, no hepatomegaly, no splenomegaly PSYCHIATRIC: the patient is alert & oriented to person, affect & behavior appropriate  LABS/RADIOLOGY: 7-15 CBC normal 5-15 potassium 4.8 3-15 cr 1.34, K 5.7 ow cmp nl 1-15 CBC normal 11-14 liver profile normal  9/14 cr 1.28 ow cmp nl  7/14 cbc nl 5/14 liver profile normal 07/2012:  Creatinine 1.37, otherwise CMP normal.   06/2012:  CBC normal.  ASSESSMENT/PLAN:  Renal insufficiency. Stable.  Alzheimer's dementia.  Stable.   Hypertension. Well controlled  Allergic rhinitis.  Well controlled.   Anxiety-stable.  CPT CODE: [id number]  Newton PiggGayani Y. Kerry Doryasanayaka, MD [facility] [phone]

## 2014-02-16 ENCOUNTER — Non-Acute Institutional Stay (SKILLED_NURSING_FACILITY): Payer: Medicare Other | Admitting: Internal Medicine

## 2014-02-16 DIAGNOSIS — I150 Renovascular hypertension: Secondary | ICD-10-CM

## 2014-02-16 DIAGNOSIS — N189 Chronic kidney disease, unspecified: Secondary | ICD-10-CM

## 2014-02-16 DIAGNOSIS — G309 Alzheimer's disease, unspecified: Secondary | ICD-10-CM

## 2014-02-16 DIAGNOSIS — F0280 Dementia in other diseases classified elsewhere without behavioral disturbance: Secondary | ICD-10-CM

## 2014-02-16 DIAGNOSIS — J309 Allergic rhinitis, unspecified: Secondary | ICD-10-CM

## 2014-02-17 NOTE — Progress Notes (Signed)
Patient ID: Sherry Savage, female   DOB: 01/27/1916, 80 y.o.   MRN: [id number]          PROGRESS NOTE  DATE: 02-16-14    FACILITY: [facility]   LEVEL OF CARE: SNF  Routine Visit  CHIEF COMPLAINT:  Manage renal insufficiency, hypertension and dementia.   HISTORY OF PRESENT ILLNESS:  REASSESSMENT OF ONGOING PROBLEM(S):  CHRONIC KIDNEY DISEASE: The patient's chronic kidney disease remains stable.  Patient denies increasing lower extremity swelling or confusion. Last BUN and creatinine are:  On 08/22/2012, patient's BUN was 15, creatinine 1.33.  On 08/18/2012, BUN was 18, creatinine 1.37, in 9/14 bun 19, cr 1.28, in 3-15 bun 15, cr 1.34, in 9-15 bun 21, creatinine 1.45.  DEMENTIA: The dementia remains stable and continues to function adequately in the current living environment with supervision.  The patient has had little changes in behavior. No complications noted from the medications presently being used.   HTN: Pt 's HTN remains stable.  Denies CP, sob, DOE, pedal edema, headaches, dizziness or visual disturbances.  No complications from the medications currently being used.  Last BP :142/84,140/86, 136/70, 140/80, 142/80, 138/76, 120/68, 122/62, 124/64, 110/70, 125/87, 115/60, 164/81, 115/78, 128/84, 116/84  PAST MEDICAL HISTORY : Reviewed.  No changes.  CURRENT MEDICATIONS: Reviewed per [facility]  REVIEW OF SYSTEMS:  GENERAL: no change in appetite, no fatigue, no weight changes, no fever, chills or weakness RESPIRATORY: no cough, SOB, DOE, wheezing, hemoptysis CARDIAC: no chest pain, edema or palpitations GI: no abdominal pain, diarrhea, constipation, heart burn, nausea or vomiting  PHYSICAL EXAMINATION  VS: see VS section  GENERAL: no acute distress, normal body habitus EYES: Normal sclerae, normal conjunctivae, no discharge NECK: supple, trachea midline, no neck masses, no thyroid tenderness, no thyromegaly LYMPHATICS: No cervical lymphadenopathy, no supraclavicular  lymphadenopathy RESPIRATORY: breathing is even & unlabored, BS CTAB CARDIAC: RRR, no murmur,no extra heart sounds, no edema GI: abdomen soft, normal BS, no masses, no tenderness, no hepatomegaly, no splenomegaly PSYCHIATRIC: the patient is alert & oriented to person, affect & behavior appropriate  LABS/RADIOLOGY: 9-15 creatinine 1.45 otherwise CMP normal 7-15 CBC normal 5-15 potassium 4.8 3-15 cr 1.34, K 5.7 ow cmp nl 1-15 CBC normal 11-14 liver profile normal  9/14 cr 1.28 ow cmp nl  7/14 cbc nl 5/14 liver profile normal 07/2012:  Creatinine 1.37, otherwise CMP normal.   06/2012:  CBC normal.  ASSESSMENT/PLAN:  Renal insufficiency. Unstable problem. Renal functions elevated. Recheck  Alzheimer's dementia.  Stable.   Hypertension. Well controlled  Allergic rhinitis.  Well controlled.   Anxiety-stable.  CPT CODE: 40981  Newton Pigg. Kerry Dory, MD [facility] [phone]

## 2014-02-21 ENCOUNTER — Non-Acute Institutional Stay (SKILLED_NURSING_FACILITY): Payer: Medicare Other | Admitting: Internal Medicine

## 2014-02-21 DIAGNOSIS — N189 Chronic kidney disease, unspecified: Secondary | ICD-10-CM

## 2014-02-26 ENCOUNTER — Non-Acute Institutional Stay (SKILLED_NURSING_FACILITY): Payer: Medicare Other | Admitting: Internal Medicine

## 2014-02-26 DIAGNOSIS — T783XXS Angioneurotic edema, sequela: Secondary | ICD-10-CM

## 2014-02-26 DIAGNOSIS — T788XXS Other adverse effects, not elsewhere classified, sequela: Secondary | ICD-10-CM

## 2014-02-26 DIAGNOSIS — T7589XS Other specified effects of external causes, sequela: Secondary | ICD-10-CM

## 2014-02-27 NOTE — Progress Notes (Signed)
Patient ID: Sherry Savage, female   DOB: 1915-11-14, 80 y.o.   MRN: [id number]           PROGRESS NOTE  DATE: 02/21/2014          [facility]  LEVEL OF CARE: SNF (31)  Acute Visit  CHIEF COMPLAINT:  Manage chronic kidney disease.    HISTORY OF PRESENT ILLNESS: I was requested by the staff to assess the patient regarding above problem(s):  CHRONIC KIDNEY DISEASE: The patient's chronic kidney disease is unstable.  Patient denies increasing lower extremity swelling or confusion. Last BUN and creatinine are:   On 02/20/2014:  BUN 28, creatinine 1.49.  On 02/08/2014:  Creatinine 1.45.  In 07/2013:  Creatinine 1.34.  On 02/09/2013:  Creatinine 1.28.  Patient is currently on Cozaar.    PAST MEDICAL HISTORY : Reviewed.  No changes/see problem list  CURRENT MEDICATIONS: Reviewed per MAR/see medication list  REVIEW OF SYSTEMS:  GENERAL: no change in appetite, no fatigue, no weight changes, no fever, chills or weakness RESPIRATORY: no cough, SOB, DOE,, wheezing, hemoptysis CARDIAC: no chest pain, edema or palpitations GI: no abdominal pain, diarrhea, constipation, heart burn, nausea or vomiting  PHYSICAL EXAMINATION  VS: see VS section  GENERAL: no acute distress, normal body habitus NECK: supple, trachea midline, no neck masses, no thyroid tenderness, no thyromegaly RESPIRATORY: breathing is even & unlabored, BS CTAB CARDIAC: RRR, no murmur,no extra heart sounds, no edema GI: abdomen soft, normal BS, no masses, no tenderness, no hepatomegaly, no splenomegaly PSYCHIATRIC: the patient is alert & oriented to person, affect & behavior appropriate  ASSESSMENT/PLAN:  Chronic kidney disease.  Unstable problem.  Patient's renal functions have trended up gradually.  We will recheck on 02/26/2014.    CPT CODE: 4098199308          Angela CoxGayani Y Trevante Tennell, MD [facility] [phone]

## 2014-03-01 NOTE — Progress Notes (Signed)
Patient ID: Sherry Savage, female   DOB: Jun 24, 1915, 80 y.o.   MRN: [id number]           PROGRESS NOTE  DATE: 02/26/2014          [facility]  LEVEL OF CARE: SNF (31)  Acute Visit  CHIEF COMPLAINT:  Manage tongue pain and swelling.    HISTORY OF PRESENT ILLNESS: I was requested by the staff to assess the patient regarding above problem(s):  Staff report that patient has been complaining of pain in her tongue and swelling.  Symptoms were first noted on 02/23/2014.  Today, patient denies any symptoms.  She denies shortness of breath or difficulty swallowing.    PAST MEDICAL HISTORY : Reviewed.  No changes/see problem list  CURRENT MEDICATIONS: Reviewed per MAR/see medication list  PHYSICAL EXAMINATION  VS: see VS section  GENERAL: no acute distress, normal body habitus MOUTH/THROAT: lips without lesions,no lesions in the mouth,tongue is without lesions,uvula elevates in midline       RESPIRATORY: breathing is even & unlabored, BS CTAB  ASSESSMENT/PLAN:  Probable angioedema.  Patient is asymptomatic now.  She is on aspirin and Cozaar.   We will monitor for now.  Assured patient.    CPT CODE: [id number]           Angela CoxGayani Y Dasanayaka, MD [facility] [phone]

## 2014-03-05 ENCOUNTER — Non-Acute Institutional Stay (SKILLED_NURSING_FACILITY): Payer: Medicare Other | Admitting: Internal Medicine

## 2014-03-05 DIAGNOSIS — N189 Chronic kidney disease, unspecified: Secondary | ICD-10-CM

## 2014-03-08 NOTE — Progress Notes (Signed)
Patient ID: Darla LeschesHattie Olejnik, female   DOB: 05/08/1916, 80 y.o.   MRN: [id number]           PROGRESS NOTE  DATE: 03/05/2014          [facility]  LEVEL OF CARE: SNF (31)  Acute Visit  CHIEF COMPLAINT:  Manage chronic kidney disease.    HISTORY OF PRESENT ILLNESS: I was requested by the staff to assess the patient regarding above problem(s):  CHRONIC KIDNEY DISEASE: The patient's chronic kidney disease remains stable.  Patient denies increasing lower extremity swelling or confusion. Last BUN and creatinine are:   On 02/28/2014:  BUN 16, creatinine 1.35.  On 02/21/2014:  Creatinine 1.49.    PAST MEDICAL HISTORY : Reviewed.  No changes/see problem list  CURRENT MEDICATIONS: Reviewed per MAR/see medication list  PHYSICAL EXAMINATION  VS: see VS section  GENERAL: no acute distress, normal body habitus RESPIRATORY: breathing is even & unlabored, BS CTAB CARDIAC: RRR, no murmur,no extra heart sounds, no edema  ASSESSMENT/PLAN:  Chronic kidney disease.  Renal functions stable.    CPT CODE: [id number]            Angela CoxGayani Y Worley Radermacher, MD [facility] [phone]

## 2014-03-14 ENCOUNTER — Non-Acute Institutional Stay (SKILLED_NURSING_FACILITY): Payer: Medicare Other | Admitting: Internal Medicine

## 2014-03-14 DIAGNOSIS — J309 Allergic rhinitis, unspecified: Secondary | ICD-10-CM

## 2014-03-14 DIAGNOSIS — G309 Alzheimer's disease, unspecified: Secondary | ICD-10-CM

## 2014-03-14 DIAGNOSIS — F0280 Dementia in other diseases classified elsewhere without behavioral disturbance: Secondary | ICD-10-CM

## 2014-03-14 DIAGNOSIS — I150 Renovascular hypertension: Secondary | ICD-10-CM

## 2014-03-14 DIAGNOSIS — N189 Chronic kidney disease, unspecified: Secondary | ICD-10-CM

## 2014-03-16 NOTE — Progress Notes (Signed)
Patient ID: Sherry Savage, female   DOB: [birth year], 80 y.o.   MRN: [id number]          PROGRESS NOTE  DATE: 03-14-14    FACILITY: [facility]   LEVEL OF CARE: SNF  Routine Visit  CHIEF COMPLAINT:  Manage CKD, hypertension and dementia.   HISTORY OF PRESENT ILLNESS:  REASSESSMENT OF ONGOING PROBLEM(S):  CHRONIC KIDNEY DISEASE: The patient's chronic kidney disease remains stable.  Patient denies increasing lower extremity swelling or confusion. Last BUN and creatinine are:  On 08/22/2012, patient's BUN was 15, creatinine 1.33.  On 08/18/2012, BUN was 18, creatinine 1.37, in 9/14 bun 19, cr 1.28, in 3-15 bun 15, cr 1.34, in 9-15 bun 21, creatinine 1.45, 02-28-14 BUN 16, creatinine 1.35.  DEMENTIA: The dementia remains stable and continues to function adequately in the current living environment with supervision.  The patient has had little changes in behavior. No complications noted from the medications presently being used.   HTN: Pt 's HTN remains stable.  Denies CP, sob, DOE, pedal edema, headaches, dizziness or visual disturbances.  No complications from the medications currently being used.  Last BP :142/84,140/86, 136/70, 140/80, 142/80, 138/76, 120/68, 122/62, 124/64, 110/70, 125/87, 115/60, 164/81, 115/78, 128/84, 116/84, 140/64.  PAST MEDICAL HISTORY : Reviewed.  No changes.  [facility]  REVIEW OF SYSTEMS:  GENERAL: no change in appetite, no fatigue, no weight changes, no fever, chills or weakness RESPIRATORY: no cough, SOB, DOE, wheezing, hemoptysis CARDIAC: no chest pain, edema or palpitations GI: no abdominal pain, diarrhea, constipation, heart burn, nausea or vomiting  PHYSICAL EXAMINATION  VS: see VS section  GENERAL: no acute distress, normal body habitus NECK: supple, trachea midline, no neck masses, no thyroid tenderness, no thyromegaly RESPIRATORY: breathing is even & unlabored, BS CTAB CARDIAC: RRR, no murmur,no extra heart  sounds, no edema GI: abdomen soft, normal BS, no masses, no tenderness, no hepatomegaly, no splenomegaly PSYCHIATRIC: the patient is alert & oriented to person, affect & behavior appropriate  LABS/RADIOLOGY: 02-20-14 BUN 16, creatinine 1.35 9-15 creatinine 1.45 otherwise CMP normal 7-15 CBC normal 5-15 potassium 4.8 3-15 cr 1.34, K 5.7 ow cmp nl 1-15 CBC normal 11-14 liver profile normal  9/14 cr 1.28 ow cmp nl  7/14 cbc nl 5/14 liver profile normal 07/2012:  Creatinine 1.37, otherwise CMP normal.   06/2012:  CBC normal.  ASSESSMENT/PLAN:  CKD- Renal functions improved.  Alzheimer's dementia.  Stable.   Renovascular Hypertension. Well controlled  Allergic rhinitis.  Well controlled.   Anxiety-stable.  CPT CODE: [id number]  Newton PiggGayani Y. Kerry Doryasanayaka, MD [facility] [phone]

## 2015-07-31 ENCOUNTER — Inpatient Hospital Stay (HOSPITAL_COMMUNITY): Payer: Medicare Other

## 2015-07-31 ENCOUNTER — Observation Stay (HOSPITAL_COMMUNITY)
Admission: EM | Admit: 2015-07-31 | Discharge: 2015-08-02 | Disposition: A | Discharge status: Skilled Nursing Facility | Payer: Medicare Other | Attending: Internal Medicine | Admitting: Internal Medicine

## 2015-07-31 ENCOUNTER — Emergency Department (HOSPITAL_COMMUNITY): Payer: Medicare Other

## 2015-07-31 ENCOUNTER — Encounter (HOSPITAL_COMMUNITY): Payer: Self-pay | Admitting: Emergency Medicine

## 2015-07-31 DIAGNOSIS — Z7982 Long term (current) use of aspirin: Secondary | ICD-10-CM | POA: Diagnosis not present

## 2015-07-31 DIAGNOSIS — E860 Dehydration: Secondary | ICD-10-CM | POA: Diagnosis not present

## 2015-07-31 DIAGNOSIS — E871 Hypo-osmolality and hyponatremia: Secondary | ICD-10-CM | POA: Insufficient documentation

## 2015-07-31 DIAGNOSIS — R251 Tremor, unspecified: Secondary | ICD-10-CM | POA: Diagnosis not present

## 2015-07-31 DIAGNOSIS — Z66 Do not resuscitate: Secondary | ICD-10-CM | POA: Insufficient documentation

## 2015-07-31 DIAGNOSIS — I129 Hypertensive chronic kidney disease with stage 1 through stage 4 chronic kidney disease, or unspecified chronic kidney disease: Secondary | ICD-10-CM | POA: Insufficient documentation

## 2015-07-31 DIAGNOSIS — N170 Acute kidney failure with tubular necrosis: Secondary | ICD-10-CM | POA: Insufficient documentation

## 2015-07-31 DIAGNOSIS — Z888 Allergy status to other drugs, medicaments and biological substances status: Secondary | ICD-10-CM | POA: Diagnosis not present

## 2015-07-31 DIAGNOSIS — J189 Pneumonia, unspecified organism: Secondary | ICD-10-CM

## 2015-07-31 DIAGNOSIS — G309 Alzheimer's disease, unspecified: Secondary | ICD-10-CM | POA: Diagnosis not present

## 2015-07-31 DIAGNOSIS — Z515 Encounter for palliative care: Secondary | ICD-10-CM | POA: Diagnosis not present

## 2015-07-31 DIAGNOSIS — R030 Elevated blood-pressure reading, without diagnosis of hypertension: Secondary | ICD-10-CM

## 2015-07-31 DIAGNOSIS — E870 Hyperosmolality and hypernatremia: Principal | ICD-10-CM | POA: Insufficient documentation

## 2015-07-31 DIAGNOSIS — N189 Chronic kidney disease, unspecified: Secondary | ICD-10-CM | POA: Diagnosis present

## 2015-07-31 DIAGNOSIS — G9340 Encephalopathy, unspecified: Secondary | ICD-10-CM | POA: Insufficient documentation

## 2015-07-31 DIAGNOSIS — A419 Sepsis, unspecified organism: Secondary | ICD-10-CM | POA: Diagnosis present

## 2015-07-31 DIAGNOSIS — G9341 Metabolic encephalopathy: Secondary | ICD-10-CM | POA: Insufficient documentation

## 2015-07-31 DIAGNOSIS — Z881 Allergy status to other antibiotic agents status: Secondary | ICD-10-CM | POA: Insufficient documentation

## 2015-07-31 DIAGNOSIS — IMO0001 Reserved for inherently not codable concepts without codable children: Secondary | ICD-10-CM

## 2015-07-31 DIAGNOSIS — F0280 Dementia in other diseases classified elsewhere without behavioral disturbance: Secondary | ICD-10-CM | POA: Diagnosis not present

## 2015-07-31 DIAGNOSIS — I4581 Long QT syndrome: Secondary | ICD-10-CM | POA: Diagnosis not present

## 2015-07-31 LAB — COMPREHENSIVE METABOLIC PANEL
ALK PHOS: 57 U/L (ref 38–126)
ALT: 27 U/L (ref 14–54)
AST: 35 U/L (ref 15–41)
Albumin: 2.6 g/dL — ABNORMAL LOW (ref 3.5–5.0)
BUN: 70 mg/dL — AB (ref 6–20)
CALCIUM: 9.3 mg/dL (ref 8.9–10.3)
CO2: 26 mmol/L (ref 22–32)
CREATININE: 3.69 mg/dL — AB (ref 0.44–1.00)
GFR calc Af Amer: 11 mL/min — ABNORMAL LOW (ref >60)
GFR, EST NON AFRICAN AMERICAN: 9 mL/min — AB (ref >60)
Glucose, Bld: 95 mg/dL (ref 65–99)
Potassium: 5.2 mmol/L — ABNORMAL HIGH (ref 3.5–5.1)
Sodium: 179 mmol/L (ref 135–145)
Total Bilirubin: 0.6 mg/dL (ref 0.3–1.2)
Total Protein: 7.1 g/dL (ref 6.5–8.1)

## 2015-07-31 LAB — CBC WITH DIFFERENTIAL/PLATELET
Basophils Absolute: 0 10*3/uL (ref 0.0–0.1)
Basophils Relative: 0 %
EOS ABS: 0 10*3/uL (ref 0.0–0.7)
EOS PCT: 0 %
HCT: 43.2 % (ref 36.0–46.0)
Hemoglobin: 13.1 g/dL (ref 12.0–15.0)
LYMPHS ABS: 2.3 10*3/uL (ref 0.7–4.0)
Lymphocytes Relative: 15 %
MCH: 29.3 pg (ref 26.0–34.0)
MCHC: 30.3 g/dL (ref 30.0–36.0)
MCV: 96.6 fL (ref 78.0–100.0)
MONOS PCT: 6 %
Monocytes Absolute: 0.8 10*3/uL (ref 0.1–1.0)
Neutro Abs: 11.6 10*3/uL — ABNORMAL HIGH (ref 1.7–7.7)
Neutrophils Relative %: 79 %
PLATELETS: 147 10*3/uL — AB (ref 150–400)
RBC: 4.47 MIL/uL (ref 3.87–5.11)
RDW: 16 % — ABNORMAL HIGH (ref 11.5–15.5)
WBC: 14.8 10*3/uL — AB (ref 4.0–10.5)

## 2015-07-31 LAB — I-STAT CG4 LACTIC ACID, ED
LACTIC ACID, VENOUS: 6.67 mmol/L — AB (ref 0.5–2.0)
Lactic Acid, Venous: 3.16 mmol/L (ref 0.5–2.0)

## 2015-07-31 LAB — PROTIME-INR
INR: 1.59 — ABNORMAL HIGH (ref 0.00–1.49)
Prothrombin Time: 19 s — ABNORMAL HIGH (ref 11.6–15.2)

## 2015-07-31 LAB — LACTIC ACID, PLASMA
LACTIC ACID, VENOUS: 3.7 mmol/L — AB (ref 0.5–2.0)
Lactic Acid, Venous: 4.5 mmol/L (ref 0.5–2.0)

## 2015-07-31 LAB — URINE MICROSCOPIC-ADD ON: Squamous Epithelial / LPF: NONE SEEN

## 2015-07-31 LAB — URINALYSIS, ROUTINE W REFLEX MICROSCOPIC
BILIRUBIN URINE: NEGATIVE
Glucose, UA: NEGATIVE mg/dL
KETONES UR: NEGATIVE mg/dL
Leukocytes, UA: NEGATIVE
Nitrite: NEGATIVE
PH: 5 (ref 5.0–8.0)
Protein, ur: 100 mg/dL — AB
SPECIFIC GRAVITY, URINE: 1.017 (ref 1.005–1.030)

## 2015-07-31 LAB — APTT: aPTT: 28 s (ref 24–37)

## 2015-07-31 LAB — PROCALCITONIN: Procalcitonin: 4.73 ng/mL

## 2015-07-31 MED ORDER — GLYCOPYRROLATE 0.2 MG/ML IJ SOLN: 0.2000 mg | INTRAMUSCULAR | Status: DC | PRN

## 2015-07-31 MED ORDER — OXYBUTYNIN CHLORIDE 5 MG PO TABS: 2.5000 mg | ORAL_TABLET | Freq: Four times a day (QID) | ORAL | Status: DC | PRN

## 2015-07-31 MED ORDER — SODIUM CHLORIDE 0.9% FLUSH: 3.0000 mL | Freq: Two times a day (BID) | INTRAVENOUS | Status: DC

## 2015-07-31 MED ORDER — ACETAMINOPHEN 650 MG RE SUPP
650.0000 mg | Freq: Once | RECTAL | Status: AC
  Administered 2015-07-31: 650 mg via RECTAL
  Filled 2015-07-31: qty 1

## 2015-07-31 MED ORDER — DEXTROSE 5 % IV SOLN: 1.0000 g | INTRAVENOUS | Status: DC

## 2015-07-31 MED ORDER — ACETAMINOPHEN 325 MG PO TABS: 650.0000 mg | ORAL_TABLET | Freq: Four times a day (QID) | ORAL | Status: DC | PRN

## 2015-07-31 MED ORDER — IPRATROPIUM-ALBUTEROL 0.5-2.5 (3) MG/3ML IN SOLN
3.0000 mL | Freq: Four times a day (QID) | RESPIRATORY_TRACT | Status: DC
  Filled 2015-07-31: qty 3

## 2015-07-31 MED ORDER — POLYVINYL ALCOHOL 1.4 % OP SOLN
1.0000 [drp] | Freq: Four times a day (QID) | OPHTHALMIC | Status: DC | PRN
  Filled 2015-07-31: qty 15

## 2015-07-31 MED ORDER — HALOPERIDOL LACTATE 2 MG/ML PO CONC
0.5000 mg | ORAL | Status: DC | PRN
  Filled 2015-07-31: qty 0.3

## 2015-07-31 MED ORDER — SODIUM CHLORIDE 0.9 % IV BOLUS (SEPSIS)
1000.0000 mL | Freq: Once | INTRAVENOUS | Status: AC
  Administered 2015-07-31: 1000 mL via INTRAVENOUS

## 2015-07-31 MED ORDER — ONDANSETRON HCL 4 MG PO TABS: 4.0000 mg | ORAL_TABLET | Freq: Four times a day (QID) | ORAL | Status: DC | PRN

## 2015-07-31 MED ORDER — HALOPERIDOL 1 MG PO TABS: 0.5000 mg | ORAL_TABLET | ORAL | Status: DC | PRN

## 2015-07-31 MED ORDER — ONDANSETRON HCL 4 MG/2ML IJ SOLN: 4.0000 mg | Freq: Four times a day (QID) | INTRAMUSCULAR | Status: DC | PRN

## 2015-07-31 MED ORDER — SODIUM CHLORIDE 0.9 % IV SOLN
1250.0000 mg | Freq: Once | INTRAVENOUS | Status: AC
  Administered 2015-07-31: 1250 mg via INTRAVENOUS
  Filled 2015-07-31: qty 1250

## 2015-07-31 MED ORDER — ACETAMINOPHEN 650 MG RE SUPP
650.0000 mg | Freq: Four times a day (QID) | RECTAL | Status: DC | PRN
Start: 2015-07-31 — End: 2015-07-31

## 2015-07-31 MED ORDER — PIPERACILLIN-TAZOBACTAM IN DEX 2-0.25 GM/50ML IV SOLN
2.2500 g | Freq: Three times a day (TID) | INTRAVENOUS | Status: DC
  Filled 2015-07-31: qty 50

## 2015-07-31 MED ORDER — MORPHINE SULFATE (CONCENTRATE) 10 MG/0.5ML PO SOLN: 5.0000 mg | ORAL | Status: DC | PRN

## 2015-07-31 MED ORDER — SODIUM CHLORIDE 0.9 % IV BOLUS (SEPSIS)
1000.0000 mL | INTRAVENOUS | Status: DC
  Administered 2015-07-31: 1000 mL via INTRAVENOUS

## 2015-07-31 MED ORDER — ONDANSETRON 4 MG PO TBDP: 4.0000 mg | ORAL_TABLET | Freq: Four times a day (QID) | ORAL | Status: DC | PRN

## 2015-07-31 MED ORDER — SODIUM CHLORIDE 0.9 % IV SOLN
INTRAVENOUS | Status: DC
  Administered 2015-08-02: via INTRAVENOUS

## 2015-07-31 MED ORDER — LORAZEPAM 2 MG/ML IJ SOLN: 0.5000 mg | Freq: Three times a day (TID) | INTRAMUSCULAR | Status: DC | PRN

## 2015-07-31 MED ORDER — SODIUM CHLORIDE 0.9 % IV BOLUS (SEPSIS): 500.0000 mL | INTRAVENOUS | Status: DC

## 2015-07-31 MED ORDER — ACETAMINOPHEN 650 MG RE SUPP: 650.0000 mg | Freq: Four times a day (QID) | RECTAL | Status: DC | PRN

## 2015-07-31 MED ORDER — DIPHENHYDRAMINE HCL 50 MG/ML IJ SOLN: 12.5000 mg | INTRAMUSCULAR | Status: DC | PRN

## 2015-07-31 MED ORDER — CHLORPROMAZINE HCL 25 MG PO TABS
25.0000 mg | ORAL_TABLET | Freq: Four times a day (QID) | ORAL | Status: DC | PRN
  Filled 2015-07-31: qty 1

## 2015-07-31 MED ORDER — TEMAZEPAM 15 MG PO CAPS: 15.0000 mg | ORAL_CAPSULE | Freq: Every evening | ORAL | Status: DC | PRN

## 2015-07-31 MED ORDER — DEXTROSE 5 % IV SOLN: 2.0000 g | Freq: Once | INTRAVENOUS | Status: DC

## 2015-07-31 MED ORDER — ENOXAPARIN SODIUM 30 MG/0.3ML ~~LOC~~ SOLN: 30.0000 mg | SUBCUTANEOUS | Status: DC

## 2015-07-31 MED ORDER — PIPERACILLIN-TAZOBACTAM 3.375 G IVPB 30 MIN
3.3750 g | Freq: Once | INTRAVENOUS | Status: AC
  Administered 2015-07-31: 3.375 g via INTRAVENOUS
  Filled 2015-07-31: qty 50

## 2015-07-31 MED ORDER — MORPHINE BOLUS VIA INFUSION
2.0000 mg | INTRAVENOUS | Status: DC | PRN
  Filled 2015-07-31: qty 2

## 2015-07-31 MED ORDER — HALOPERIDOL LACTATE 5 MG/ML IJ SOLN: 0.5000 mg | INTRAMUSCULAR | Status: DC | PRN

## 2015-07-31 MED ORDER — BIOTENE DRY MOUTH MT LIQD: 15.0000 mL | OROMUCOSAL | Status: DC | PRN

## 2015-07-31 MED ORDER — MORPHINE SULFATE (PF) 2 MG/ML IV SOLN
2.0000 mg | INTRAVENOUS | Status: DC | PRN
Start: 2015-07-31 — End: 2015-07-31

## 2015-07-31 MED ORDER — LORAZEPAM 2 MG/ML IJ SOLN: 1.0000 mg | INTRAMUSCULAR | Status: DC | PRN

## 2015-07-31 MED ORDER — GLYCOPYRROLATE 1 MG PO TABS
1.0000 mg | ORAL_TABLET | ORAL | Status: DC | PRN
  Filled 2015-07-31: qty 1

## 2015-07-31 MED ORDER — MORPHINE SULFATE 25 MG/ML IV SOLN
5.0000 mg/h | INTRAVENOUS | Status: DC | PRN
  Filled 2015-07-31: qty 10

## 2015-07-31 MED ORDER — VANCOMYCIN HCL IN DEXTROSE 1-5 GM/200ML-% IV SOLN: 1000.0000 mg | Freq: Once | INTRAVENOUS | Status: DC

## 2015-07-31 MED ORDER — IPRATROPIUM-ALBUTEROL 0.5-2.5 (3) MG/3ML IN SOLN: 3.0000 mL | Freq: Four times a day (QID) | RESPIRATORY_TRACT | Status: DC | PRN

## 2015-07-31 NOTE — ED Notes (Signed)
Admitting at bedside 

## 2015-07-31 NOTE — ED Notes (Signed)
Attempted multiple IV starts and all except one positional IV in left thumb have infiltrated; admitting aware

## 2015-07-31 NOTE — ED Notes (Signed)
Neurology and admitting at bedside

## 2015-07-31 NOTE — ED Provider Notes (Addendum)
CSN: [id number]     Arrival date & time 07/31/15  0848 History   First MD Initiated Contact with Patient 07/31/15 [phone]     Chief Complaint  Patient presents with  . Altered Mental Status  . Code Sepsis     (Consider location/radiation/quality/duration/timing/severity/associated sxs/prior Treatment) HPI   Patient is a 82 year old female with past medical history significant for Alzheimer's, hypertension, chronic kidney disease. She is presenting today with altered mental status. EMS was reportedly called to the scene because she was receiving rectal Tylenol and the nurse noticed some blood in her glove. On arrival here patient is febrile, with elevated respiratory rate. Patient not very communicative at baseline. Unable to give [place] symptoms. Reportedly recently treated for UTI.   Level 5 caveat altered mental status.  Past Medical History  Diagnosis Date  . 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 Alzheimer's disease 11/02/2012  . Allergic rhinitis, cause unspecified 11/02/2012  . Chronic kidney disease, unspecified (HCC) 11/02/2012   History reviewed. No pertinent past surgical history. No family history on file. Social History  Substance Use Topics  . Smoking status: Never Smoker   . Smokeless tobacco: None  . Alcohol Use: None   OB History    No data available     Review of Systems  Unable to perform ROS: Dementia      Allergies  Cozaar  Home Medications   Prior to Admission medications   Medication Sig Start Date End Date Taking? Authorizing Provider  acetaminophen (TYLENOL) 120 MG suppository Place 120 mg rectally once as needed for fever.   Yes Historical Provider, MD  albuterol (PROVENTIL) (2.5 MG/3ML) 0.083% nebulizer solution Take 2.5 mg by nebulization every 4 (four) hours as needed for wheezing.   Yes Historical Provider, MD  amLODipine (NORVASC) 5 MG tablet Take 5 mg by mouth daily.   Yes Historical Provider, MD  aspirin 81 MG tablet Take 81 mg  by mouth daily.   Yes Historical Provider, MD  cloNIDine (CATAPRES) 0.1 MG tablet Take 0.1 mg by mouth 2 (two) times daily as needed (SBP >/=110 and DBP >/=110).   Yes Historical Provider, MD  donepezil (ARICEPT ODT) 10 MG disintegrating tablet Take 10 mg by mouth every morning.   Yes Historical Provider, MD  lactose free nutrition (BOOST PLUS) LIQD Take 237 mLs by mouth 3 (three) times daily between meals.   Yes Historical Provider, MD  loratadine (CLARITIN) 10 MG tablet Take 10 mg by mouth daily.   Yes Historical Provider, MD  LORazepam (ATIVAN) 0.5 MG tablet Take 1/2 tablet by mouth every night at bedtime for anxiety; Take 1/2 tablet by mouth daily as needed for anxiety Patient not taking: Reported on 07/31/2015 09/13/13   Sharon Seller, NP   BP 161/138 mmHg  Pulse 62  Temp(Src) 101.5 F (38.6 C) (Rectal)  Resp 32  Ht 4' 11.84" (1.52 m)  Wt 150 lb (68.04 kg)  BMI 29.45 kg/m2  SpO2 100% Physical Exam  Constitutional: She appears well-developed and well-nourished.  Patient cachectic frail 82 year old female curled into a ball  1 to touch.  HENT:  Head: Normocephalic.  Eyes: Right eye exhibits no discharge. Left eye exhibits no discharge.  Neck: Neck supple.  Cardiovascular: Regular rhythm and normal heart sounds.   No murmur heard. Tachycardic.  Pulmonary/Chest: Effort normal and breath sounds normal. She has no wheezes. She has no rales.  Elevated respiratory rate. No focal lung findings.  Abdominal: Soft. There is tenderness.  Mild grimace abdominal  exam diffusely.  Musculoskeletal: Normal range of motion. She exhibits no edema.  Neurological:  Patient noncommunicative  Skin: Skin is warm and dry. She is not diaphoretic.  Nursing note and vitals reviewed.   ED Course  Procedures (including critical care time) Labs Review Labs Reviewed  COMPREHENSIVE METABOLIC PANEL - Abnormal; Notable for the following:    Sodium 179 (*)    Potassium 5.2 (*)    Chloride >130 (*)     BUN 70 (*)    Creatinine, Ser 3.69 (*)    Albumin 2.6 (*)    GFR calc non Af Amer 9 (*)    GFR calc Af Amer 11 (*)    All other components within normal limits  URINALYSIS, ROUTINE W REFLEX MICROSCOPIC (NOT AT [facility]) - Abnormal; Notable for the following:    APPearance CLOUDY (*)    Hgb urine dipstick LARGE (*)    Protein, ur 100 (*)    All other components within normal limits  CBC WITH DIFFERENTIAL/PLATELET - Abnormal; Notable for the following:    WBC 14.8 (*)    RDW 16.0 (*)    Platelets 147 (*)    Neutro Abs 11.6 (*)    All other components within normal limits  URINE MICROSCOPIC-ADD ON - Abnormal; Notable for the following:    Bacteria, UA FEW (*)    Casts GRANULAR CAST (*)    All other components within normal limits  I-STAT CG4 LACTIC ACID, ED - Abnormal; Notable for the following:    Lactic Acid, Venous 3.16 (*)    All other components within normal limits  CULTURE, BLOOD (ROUTINE X 2)  CULTURE, BLOOD (ROUTINE X 2)  URINE CULTURE  CBC WITH DIFFERENTIAL/PLATELET  I-STAT CHEM 8, ED  I-STAT CG4 LACTIC ACID, ED    Imaging Review Dg Chest Portable 1 View  07/31/2015  CLINICAL DATA:  Sepsis EXAM: PORTABLE CHEST 1 VIEW COMPARISON:  Portable exam 0948 hours without priors for comparison. FINDINGS: Upper normal heart size. Mediastinal contours and pulmonary vascularity normal. RIGHT basilar infiltrate likely representing RIGHT lower lobe pneumonia. Minimal central peribronchial thickening. Calcified granuloma lower RIGHT chest. Remaining lungs clear. Atherosclerotic calcification aorta. No pleural effusion or pneumothorax. Bones demineralized. IMPRESSION: RIGHT basilar infiltrate likely representing RIGHT lower lobe pneumonia. Electronically Signed   By: Ulyses Southward M.D.   On: 07/31/2015 09:56   I have personally reviewed and evaluated these images and lab results as part of my medical decision-making.   EKG Interpretation None      MDM   Final diagnoses:  None     Patient is an elderly frail 82 year old female with Alzheimer's disease presenting today with fever. Patient unable to give history. According to Alzheimer's unit, recently treated for a UTI. Patient meets sepsis protocol. Will initiate protocol, given broad-spectrum antibiotics, lactic and blood cultures ordered, 1 L fluid ordered.  Pt with AMS, ? Ongoing seizures.   1:26 PM Chest x-ray shows likely infection. Discussed with the nursing home and patient has had a slow decline. Labs show hypernatremia. Likely source of seziures. Patient's friend is at bedside. Patient has no family. Patient's friend is the Management consultant. Had a long discussion with patient this friend about what patient's wishes are. Patient had advanced dementia. Does not wish any aggressive interventions. No central lines, no dialysis DNR/DNI.  DIscussed with hospitlitst.  Step-down vs floor.    CRITICAL CARE Performed by: Arlana Hove Total critical care time: 60 minutes Critical care time was exclusive of separately  billable procedures and treating other patients. Critical care was necessary to treat or prevent imminent or life-threatening deterioration. Critical care was time spent personally by me on the following activities: development of treatment plan with patient and/or surrogate as well as nursing, discussions with consultants, evaluation of patient's response to treatment, examination of patient, obtaining history from patient or surrogate, ordering and performing treatments and interventions, ordering and review of laboratory studies, ordering and review of radiographic studies, pulse oximetry and re-evaluation of patient's condition.   Courteney Randall An, MD 07/31/15 1314  Courteney Randall An, MD 07/31/15 1314  Courteney Randall An, MD 07/31/15 1328

## 2015-07-31 NOTE — Consult Note (Signed)
Requesting Physician: Dr.  Konrad Dolores    Reason for consultation: Altered mental status  HPI:                                                                                                                                         Sherry Savage is an 82 y.o. female patient who presented with altered mental status. She was noted to have severe hyponatremia with sodium of 179, creatinine 3.69, lactate 4.5. She was noted to have some tremulous movements in her upper extremities. Due to concern for possible seizure a stat EEG was requested by Dr. Konrad Dolores. Neurology is consulted for further evaluation of altered mental status and to evaluate for seizures.    Past Medical History: Past Medical History  Diagnosis Date  . 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 Alzheimer's disease 11/02/2012  . Allergic rhinitis, cause unspecified 11/02/2012  . Chronic kidney disease, unspecified (HCC) 11/02/2012    History reviewed. No pertinent past surgical history.  Family History: No family history on file.  Social History:   reports that she has never smoked. She does not have any smokeless tobacco history on file. Her alcohol and drug histories are not on file.  Allergies:  Allergies  Allergen Reactions  . Cozaar [Losartan Potassium]   . Vancomycin Other (See Comments)    Redness, swelling at IV site     Medications:                                                                                                                         Current facility-administered medications:  .  0.9 %  sodium chloride infusion, , Intravenous, Continuous, Ozella Rocks, MD .  acetaminophen (TYLENOL) tablet 650 mg, 650 mg, Oral, Q6H PRN **OR** acetaminophen (TYLENOL) suppository 650 mg, 650 mg, Rectal, Q6H PRN, Ozella Rocks, MD .  antiseptic oral rinse (BIOTENE) solution 15 mL, 15 mL, Topical, PRN, Ozella Rocks, MD .  chlorproMAZINE (THORAZINE) tablet 25 mg, 25 mg, Oral, QID PRN, Ozella Rocks, MD .  diphenhydrAMINE (BENADRYL) injection 12.5 mg, 12.5 mg, Intravenous, Q4H PRN, Ozella Rocks, MD .  glycopyrrolate (ROBINUL) tablet 1 mg, 1 mg, Oral, Q4H PRN **OR** glycopyrrolate (ROBINUL) injection 0.2 mg, 0.2 mg, Subcutaneous, Q4H PRN **OR** glycopyrrolate (ROBINUL) injection 0.2 mg, 0.2 mg, Intravenous, Q4H PRN, Ozella Rocks, MD .  haloperidol (HALDOL) tablet  0.5 mg, 0.5 mg, Oral, Q4H PRN **OR** haloperidol (HALDOL) 2 MG/ML solution 0.5 mg, 0.5 mg, Sublingual, Q4H PRN **OR** haloperidol lactate (HALDOL) injection 0.5 mg, 0.5 mg, Intravenous, Q4H PRN, Ozella Rocks, MD .  ipratropium-albuterol (DUONEB) 0.5-2.5 (3) MG/3ML nebulizer solution 3 mL, 3 mL, Nebulization, Q6H, Ozella Rocks, MD .  LORazepam (ATIVAN) injection 1 mg, 1 mg, Intravenous, Q4H PRN, Ozella Rocks, MD .  morphine 250 mg in dextrose 5 % 250 mL (1 mg/mL) infusion, 5 mg/hr, Intravenous, Continuous PRN, Ozella Rocks, MD .  morphine bolus via infusion 2 mg, 2 mg, Intravenous, Q15 min PRN, Ozella Rocks, MD .  morphine CONCENTRATE 10 MG/0.5ML oral solution 5 mg, 5 mg, Oral, Q2H PRN **OR** morphine CONCENTRATE 10 MG/0.5ML oral solution 5 mg, 5 mg, Sublingual, Q2H PRN, Ozella Rocks, MD .  ondansetron (ZOFRAN-ODT) disintegrating tablet 4 mg, 4 mg, Oral, Q6H PRN **OR** ondansetron (ZOFRAN) injection 4 mg, 4 mg, Intravenous, Q6H PRN, Ozella Rocks, MD .  oxybutynin [facility]) tablet 2.5 mg, 2.5 mg, Oral, QID PRN, Ozella Rocks, MD .  polyvinyl alcohol (LIQUIFILM TEARS) 1.4 % ophthalmic solution 1 drop, 1 drop, Both Eyes, QID PRN, Ozella Rocks, MD .  temazepam (RESTORIL) capsule 15 mg, 15 mg, Oral, QHS PRN, Ozella Rocks, MD  Current outpatient prescriptions:  .  acetaminophen (TYLENOL) 120 MG suppository, Place 120 mg rectally once as needed for fever., Disp: , Rfl:  .  albuterol (PROVENTIL) (2.5 MG/3ML) 0.083% nebulizer solution, Take 2.5 mg by nebulization every 4 (four) hours as needed for  wheezing., Disp: , Rfl:  .  amLODipine (NORVASC) 5 MG tablet, Take 5 mg by mouth daily., Disp: , Rfl:  .  aspirin 81 MG tablet, Take 81 mg by mouth daily., Disp: , Rfl:  .  cloNIDine (CATAPRES) 0.1 MG tablet, Take 0.1 mg by mouth 2 (two) times daily as needed (SBP >/=110 and DBP >/=110)., Disp: , Rfl:  .  donepezil (ARICEPT ODT) 10 MG disintegrating tablet, Take 10 mg by mouth every morning., Disp: , Rfl:  .  lactose free nutrition (BOOST PLUS) LIQD, Take 237 mLs by mouth 3 (three) times daily between meals., Disp: , Rfl:  .  loratadine (CLARITIN) 10 MG tablet, Take 10 mg by mouth daily., Disp: , Rfl:  .  LORazepam (ATIVAN) 0.5 MG tablet, Take 1/2 tablet by mouth every night at bedtime for anxiety; Take 1/2 tablet by mouth daily as needed for anxiety (Patient not taking: Reported on 07/31/2015), Disp: 30 tablet, Rfl: 5   ROS:                                                                                                                                       History   unobtainable from patient due to mental status     Neurologic Examination:  Blood pressure 114/44, pulse 81, temperature 101.5 F (38.6 C), temperature source Rectal, resp. rate 29, height 4' 11.84" (1.52 m), weight 68.04 kg (150 lb), SpO2 98 %.  Evaluation of higher integrative functions including: Level of alertness: Very drowsy  Oriented to time, place and person - unable to assess due to mental status changes Speech: Nonverbal, does not follow commands   Test the following cranial nerves: Limited evaluation, pupils are pinpoint bilaterally , no gaze deviation looks randomly in either direction with no apparent extra ocular muscle weakness noted, facial grimace grossly symmetric.  Motor examination: Rigidity and cogwheeling noted in bilateral upper extremities and lower extremities suggestive of Parkinson's disease,  symmetric  antigravity strength in all extremities , limited evaluation due to poor cooperation  Examination of sensation : Unable to assess Test coordination: Unable to assess limb appendicular ataxia, she does have intermittent resting tremor in bilateral upper extremities  Gait: Unable to assess   Lab Results: Basic Metabolic Panel:  Recent Labs Lab 07/31/15 0929  NA 179*  K 5.2*  CL >130*  CO2 26  GLUCOSE 95  BUN 70*  CREATININE 3.69*  CALCIUM 9.3    Liver Function Tests:  Recent Labs Lab 07/31/15 0929  AST 35  ALT 27  ALKPHOS 57  BILITOT 0.6  PROT 7.1  ALBUMIN 2.6*   No results for input(s): LIPASE, AMYLASE in the last 168 hours. No results for input(s): AMMONIA in the last 168 hours.  CBC:  Recent Labs Lab 07/31/15 1045  WBC 14.8*  NEUTROABS 11.6*  HGB 13.1  HCT 43.2  MCV 96.6  PLT 147*    Cardiac Enzymes: No results for input(s): CKTOTAL, CKMB, CKMBINDEX, TROPONINI in the last 168 hours.  Lipid Panel: No results for input(s): CHOL, TRIG, HDL, CHOLHDL, VLDL, LDLCALC in the last 168 hours.  CBG: No results for input(s): GLUCAP in the last 168 hours.  Microbiology: No results found for this or any previous visit.   Imaging: Ct Head Wo Contrast  07/31/2015  CLINICAL DATA:  Pt septic, probably from UTI; decline in mental status x 5 days, febrile now, non-responsive EXAM: CT HEAD WITHOUT CONTRAST TECHNIQUE: Contiguous axial images were obtained from the base of the skull through the vertex without intravenous contrast. COMPARISON:  None. FINDINGS: There is no evidence of mass effect, midline shift, or extra-axial fluid collections. There is no evidence of a space-occupying lesion or intracranial hemorrhage. There is no evidence of a cortical-based area of acute infarction. There is generalized cerebral atrophy. There is periventricular white matter low attenuation likely secondary to microangiopathy. The ventricles and sulci are appropriate for the patient's age.  The basal cisterns are patent. Visualized portions of the orbits are unremarkable. The visualized portions of the paranasal sinuses and mastoid air cells are unremarkable. Cerebrovascular atherosclerotic calcifications are noted. The osseous structures are unremarkable. IMPRESSION: 1. No acute intracranial pathology. 2. Chronic microvascular disease and cerebral atrophy. Electronically Signed   By: Elige Ko   On: 07/31/2015 14:02   Dg Chest Portable 1 View  07/31/2015  CLINICAL DATA:  Sepsis EXAM: PORTABLE CHEST 1 VIEW COMPARISON:  Portable exam 0948 hours without priors for comparison. FINDINGS: Upper normal heart size. Mediastinal contours and pulmonary vascularity normal. RIGHT basilar infiltrate likely representing RIGHT lower lobe pneumonia. Minimal central peribronchial thickening. Calcified granuloma lower RIGHT chest. Remaining lungs clear. Atherosclerotic calcification aorta. No pleural effusion or pneumothorax. Bones demineralized. IMPRESSION: RIGHT basilar infiltrate likely representing RIGHT lower lobe pneumonia. Electronically Signed   By:  Ulyses Southward M.D.   On: 07/31/2015 09:56    Assessment and plan:   Sherry Savage is an 82 y.o. female patient who presented with altered mental status, noted to have severe metabolic abnormality is with severe hypernatremia of 179, renal failure, creatinine of 3.6 and elevated lactate of 4.5. EEG showed evidence of active moderate encephalopathy with mid theta background slowing, intermittent abnormal discharges, but no electrographic seizures. She also has clinical evidence of advanced Parkinson's disease with rigidity cogwheeling and intermittent resting tremor. Based on a moderate encephalopathy, at her advanced age of 82 years old,  prognosis for meaningful neurological recovery is guarded.  Defer management of her medical issues to primary hospitalist team. No further neurodiagnostic testing is recommended at this time.  We'll sign off. Please call  for any further questions.

## 2015-07-31 NOTE — Procedures (Signed)
  Current facility-administered medications:  .  0.9 %  sodium chloride infusion, , Intravenous, Continuous, Ozella Rocks, MD .  acetaminophen (TYLENOL) tablet 650 mg, 650 mg, Oral, Q6H PRN **OR** acetaminophen (TYLENOL) suppository 650 mg, 650 mg, Rectal, Q6H PRN, Ozella Rocks, MD .  antiseptic oral rinse (BIOTENE) solution 15 mL, 15 mL, Topical, PRN, Ozella Rocks, MD .  chlorproMAZINE (THORAZINE) tablet 25 mg, 25 mg, Oral, QID PRN, Ozella Rocks, MD .  diphenhydrAMINE (BENADRYL) injection 12.5 mg, 12.5 mg, Intravenous, Q4H PRN, Ozella Rocks, MD .  glycopyrrolate (ROBINUL) tablet 1 mg, 1 mg, Oral, Q4H PRN **OR** glycopyrrolate (ROBINUL) injection 0.2 mg, 0.2 mg, Subcutaneous, Q4H PRN **OR** glycopyrrolate (ROBINUL) injection 0.2 mg, 0.2 mg, Intravenous, Q4H PRN, Ozella Rocks, MD .  haloperidol (HALDOL) tablet 0.5 mg, 0.5 mg, Oral, Q4H PRN **OR** haloperidol (HALDOL) 2 MG/ML solution 0.5 mg, 0.5 mg, Sublingual, Q4H PRN **OR** haloperidol lactate (HALDOL) injection 0.5 mg, 0.5 mg, Intravenous, Q4H PRN, Ozella Rocks, MD .  ipratropium-albuterol (DUONEB) 0.5-2.5 (3) MG/3ML nebulizer solution 3 mL, 3 mL, Nebulization, Q6H PRN, Ozella Rocks, MD .  LORazepam (ATIVAN) injection 1 mg, 1 mg, Intravenous, Q4H PRN, Ozella Rocks, MD .  morphine 250 mg in dextrose 5 % 250 mL (1 mg/mL) infusion, 5 mg/hr, Intravenous, Continuous PRN, Ozella Rocks, MD .  morphine bolus via infusion 2 mg, 2 mg, Intravenous, Q15 min PRN, Ozella Rocks, MD .  morphine CONCENTRATE 10 MG/0.5ML oral solution 5 mg, 5 mg, Oral, Q2H PRN **OR** morphine CONCENTRATE 10 MG/0.5ML oral solution 5 mg, 5 mg, Sublingual, Q2H PRN, Ozella Rocks, MD .  ondansetron (ZOFRAN-ODT) disintegrating tablet 4 mg, 4 mg, Oral, Q6H PRN **OR** ondansetron (ZOFRAN) injection 4 mg, 4 mg, Intravenous, Q6H PRN, Ozella Rocks, MD .  oxybutynin [facility]) tablet 2.5 mg, 2.5 mg, Oral, QID PRN, Ozella Rocks, MD .  polyvinyl alcohol  (LIQUIFILM TEARS) 1.4 % ophthalmic solution 1 drop, 1 drop, Both Eyes, QID PRN, Ozella Rocks, MD .  temazepam (RESTORIL) capsule 15 mg, 15 mg, Oral, QHS PRN, Ozella Rocks, MD  Introduction:  This is a 19 channel routine scalp EEG performed at the bedside with bipolar and monopolar montages arranged in accordance to the international 10/20 system of electrode placement. One channel was dedicated to EKG recording.    Findings:  The generalized background slowing in the range of 5-6 Hz noted . A few occasional intermittent abnormal epileptiform discharges were seen in the left parietal region. No evidence of electrographic seizures were noted during this recording. On the video recording, patient is noted to have some intermittent tremors in her upper extremities, with no EEG correlate.   Impression:  This is an abnormal routine inpatient EEG suggestive of cortical irritability in the left parietal region, with a superimposed moderate encephalopathy. No evidence of electrographic seizures were seen during this recording.  Clinical correlation is recommended .

## 2015-07-31 NOTE — ED Notes (Signed)
Attempted report 

## 2015-07-31 NOTE — Progress Notes (Addendum)
Pharmacy Antibiotic Note  Sherry Savage is a 82 y.o. female admitted on 07/31/2015 with sepsis.  Pharmacy has been consulted for vancomycin and zosyn dosing. Tmax is 102.2 and WBC is elevated at 14.8. SCr is elevated at 3.69 but unclear what baseline is at this point. Lactic acid is elevated at 3.15.   Plan: - Zosyn 3.375gm IV x 1 then 2.25gm IV Q8H - Vancomycin  IV x 1 then f/u Scr trend for further doses - F/u renal fxn, C&S, clinical status and trough at SS  Height: 4' 11.84" (152 cm) Weight: 150 lb (68.04 kg) IBW/kg (Calculated) : 45.14  Temp (24hrs), Avg:102.2 F (39 C), Min:102.2 F (39 C), Max:102.2 F (39 C)   Recent Labs Lab 07/31/15 0929 07/31/15 0955 07/31/15 1045  WBC  --   --  14.8*  CREATININE 3.69*  --   --   LATICACIDVEN  --  3.16*  --     Estimated Creatinine Clearance: 7.5 mL/min (by C-G formula based on Cr of 3.69).    Allergies  Allergen Reactions  . Cozaar [Losartan Potassium]     Antimicrobials this admission: Vanc 3/1>> Zosyn 3/1>>  Dose adjustments this admission: N/A  Microbiology results: Pending  Thank you for allowing pharmacy to be a part of this patient's care.  Sherry Savage, Drake Leach 07/31/2015 11:35 AM  Addendum: Changing zosyn to cefpeime  Plan: - Cefepime 2gm IV x 1 then 1gm IV Q24H - F/u renal fxn, C&S, clinical status  Sherry Savage, PharmD, BCPS Pager # [phone] 07/31/2015 2:11 PM

## 2015-07-31 NOTE — H&P (Signed)
[facility] History and Physical  Sherry Savage UJW:[id number] DOB: April 10, 1916 DOA: 07/31/2015  Referring physician: Emergency Department PCP: Sherry Cox, MD   CHIEF COMPLAINT:                   HPI: Sherry Savage is a 82 y.o. female  With HTN, CKD and dementia. Patient brought in by EMS from [facility] with some complaints of rectal bleeding, fever and decline in mental status. Patient recently treated for a UTI. Patient unable to provide any history. She is unable to engage in any conversation, does not follow commands       ED COURSE:           Labs:   WBC 14.8, plate 562 Na+ 130, chloride > 130 BUN 70 Cr 3.69 Lactic acid 3.16, up to 6.67  Urinalysis:    Cloudy, few bacteria, negative leuk            CXR:     probable RLL pna  EKG:    Sinus tachycardia Ventricular premature complex Right atrial enlargement Low voltage, precordial leads Borderline prolonged QT interval                    Medications  piperacillin-tazobactam (ZOSYN) IVPB 2.25 g (not administered)  acetaminophen (TYLENOL) suppository 650 mg (650 mg Rectal Given 07/31/15 0909)  sodium chloride 0.9 % bolus 1,000 mL (0 mLs Intravenous Stopped 07/31/15 1052)  vancomycin (VANCOCIN) 1,250 mg in sodium chloride 0.9 % 250 mL IVPB (0 mg Intravenous Stopped 07/31/15 1208)  piperacillin-tazobactam (ZOSYN) IVPB 3.375 g (0 g Intravenous Stopped 07/31/15 1052)    Review of Systems  Unable to perform ROS   Past Medical History  Diagnosis Date  . 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 Alzheimer's disease 11/02/2012  . Allergic rhinitis, cause unspecified 11/02/2012  . Chronic kidney disease, unspecified (HCC) 11/02/2012   History reviewed. No pertinent past surgical history.  SOCIAL HISTORY:  reports that she has never smoked. She does not have any smokeless tobacco history on file. Her alcohol and drug histories are not on file. Lives: at [facility] devices:   None needed  for ambulation.   Allergies  Allergen Reactions  . Cozaar [Losartan Potassium]     No family history on file. Unobtainable  Prior to Admission medications   Medication Sig Start Date End Date Taking? Authorizing Provider  acetaminophen (TYLENOL) 120 MG suppository Place 120 mg rectally once as needed for fever.   Yes Historical Provider, MD  albuterol (PROVENTIL) (2.5 MG/3ML) 0.083% nebulizer solution Take 2.5 mg by nebulization every 4 (four) hours as needed for wheezing.   Yes Historical Provider, MD  amLODipine (NORVASC) 5 MG tablet Take 5 mg by mouth daily.   Yes Historical Provider, MD  aspirin 81 MG tablet Take 81 mg by mouth daily.   Yes Historical Provider, MD  cloNIDine (CATAPRES) 0.1 MG tablet Take 0.1 mg by mouth 2 (two) times daily as needed (SBP >/=110 and DBP >/=110).   Yes Historical Provider, MD  donepezil (ARICEPT ODT) 10 MG disintegrating tablet Take 10 mg by mouth every morning.   Yes Historical Provider, MD  lactose free nutrition (BOOST PLUS) LIQD Take 237 mLs by mouth 3 (three) times daily between meals.   Yes Historical Provider, MD  loratadine (CLARITIN) 10 MG tablet Take 10 mg by mouth daily.   Yes Historical Provider, MD  LORazepam (ATIVAN) 0.5 MG tablet Take 1/2 tablet by mouth  every night at bedtime for anxiety; Take 1/2 tablet by mouth daily as needed for anxiety Patient not taking: Reported on 07/31/2015 09/13/13   Sharon Seller, NP   PHYSICAL EXAM: Filed Vitals:   07/31/15 1130 07/31/15 1200 07/31/15 1230 07/31/15 1236  BP: 127/63 110/93 145/84 145/84  Pulse:    85  Temp:    101.5 F (38.6 C)  TempSrc:    Rectal  Resp: 26 28 28 29   Height:      Weight:      SpO2:    100%    Wt Readings from Last 3 Encounters:  07/31/15 68.04 kg (150 lb)  03/16/14 68.04 kg (150 lb)  02/26/14 68.04 kg (150 lb)    General:  Black female lying in bed with eyes closed. Doesn't respond to verbal stimuli. Weak cough Eyes: Resists exam but  pupils pinpoint, equal.  njunctiva ENT: will not open mouth Neck: no LAD, no masses Cardiovascular: RRR, . No LE edema.  Respiratory: Respirations even and unlabored. Normal respiratory effort. No wheezing heard.  Abdomen: soft, non-distended, non-tender, active bowel sounds. No obvious masses.  Skin: no rash seen on limited exam Neurologic: periodic fine jerking of bilateral upper extremities.          LABS ON ADMISSION:    Basic Metabolic Panel:  Recent Labs Lab 07/31/15 0929  NA 179*  K 5.2*  CL >130*  CO2 26  GLUCOSE 95  BUN 70*  CREATININE 3.69*  CALCIUM 9.3   Liver Function Tests:  Recent Labs Lab 07/31/15 0929  AST 35  ALT 27  ALKPHOS 57  BILITOT 0.6  PROT 7.1  ALBUMIN 2.6*    CBC:  Recent Labs Lab 07/31/15 1045  WBC 14.8*  NEUTROABS 11.6*  HGB 13.1  HCT 43.2  MCV 96.6  PLT 147*    CREATININE: 3.69 mg/dL ABNORMAL (16/10/96 0454) Estimated creatinine clearance - 7.5 mL/min  Radiological Exams on Admission: Dg Chest Portable 1 View  07/31/2015  CLINICAL DATA:  Sepsis EXAM: PORTABLE CHEST 1 VIEW COMPARISON:  Portable exam 0948 hours without priors for comparison. FINDINGS: Upper normal heart size. Mediastinal contours and pulmonary vascularity normal. RIGHT basilar infiltrate likely representing RIGHT lower lobe pneumonia. Minimal central peribronchial thickening. Calcified granuloma lower RIGHT chest. Remaining lungs clear. Atherosclerotic calcification aorta. No pleural effusion or pneumothorax. Bones demineralized. IMPRESSION: RIGHT basilar infiltrate likely representing RIGHT lower lobe pneumonia. Electronically Signed   By: Ulyses Southward M.D.   On: 07/31/2015 09:56    ASSESSMENT / PLAN   Sepsis, possibly secondary to HCAP. She could be aspirating as well. Patient is febrile with tachypnea, leukocytosis 14.8,  and lactic acid of 3.16. Discussed with POA who is not ready for comfort care measures yet.  -admit to Stepdown -Sepsis order set utilized -Vanco / zosyn given in ED  - Reaction to Vanco at IV site. Resolved after discontinuation of Vanco.  Patient should be covered for sepsis / HCAP / and possible aspiration.  Spoke with Pharmacy, we could retry Vanco at slower rate. Other option is to consult ID for Zyvox for MRSA coverage.  For now will retry Vanco at slower rate -keep HOB 30 degrees - risk of aspiraton -fluid resus in progress -prn 02    Hypernatremia, profound. Sodium 179. Probably secondary to dehydration. Patient having intermittent jerking, ? Seizures. Goal is to decrease Na+ by 12 in 24 hours. Will recheck BMET now.  -continue fluid resus -EEG now -monitor on Telemetry -check Q6 bmet  CKD,  stage unknown. No baseline labs in computer. Current Cr 3.69.  -Pharmacy to dose antibiotics -follow BMET -May improve with IVF  Dementia. According to POA, patient was ambulatory prior to admission. Otherwise, functional status unknown.      CONSULTANTS:  Palliative Care    Code Status: DNR / DNI  DVT Prophylaxis: Lovenox, reduced dose  Family Communication:  She apparently has no family. Spoke over the phone to patient's friend / POA Sherry Savage at [phone]. Discussed current condition. Lucendia Herrlich understands that Ms. Fleece is very ill with a poor chance of recovery. She understands patient at risk for seizures with elevated sodium. Lucendia Herrlich isn't quite ready to start comfort care but also agrees that it may not be too far down the road.  Disposition Plan: Depends on clinical course. Patient could pass this admission.    Time spent: 60 minutes Willette Cluster  NP [facility] Pager [phone]

## 2015-07-31 NOTE — Progress Notes (Signed)
EEG completed; results pending.    

## 2015-07-31 NOTE — Progress Notes (Signed)
Pharmacy Code Sepsis Protocol  Time of code sepsis page: 0915  Antibiotics delivered at 0955  Antibiotics administered prior to code at  (if checked, omit next 2 questions)  Were antibiotics ordered at the time of the code sepsis page? No Was it required to contact the physician?  Physician not contacted  Physician contacted to order antibiotics for code sepsis  Physician contacted to recommend changing antibiotics  Pharmacy consulted for: vancomycin and zosyn  Anti-infectives    Start     Dose/Rate Route Frequency Ordered Stop   07/31/15 1000  vancomycin (VANCOCIN) 1,250 mg in sodium chloride 0.9 % 250 mL IVPB     1,250 mg 166.7 mL/hr over 90 Minutes Intravenous  Once 07/31/15 0952     07/31/15 1000  piperacillin-tazobactam (ZOSYN) IVPB 3.375 g     3.375 g 100 mL/hr over 30 Minutes Intravenous  Once 07/31/15 1610          Nurse education provided:  Minutes left to administer antibiotics to achieve 1 hour goal  Correct order of antibiotic administration  Antibiotic Y-site compatibilities     Laterrance Nauta, Drake Leach, PharmD 07/31/2015, 9:56 AM

## 2015-07-31 NOTE — ED Notes (Signed)
Phlebotomy at bedside.

## 2015-07-31 NOTE — ED Notes (Signed)
Paged Dr. Shelly Flatten to [facility]

## 2015-07-31 NOTE — ED Notes (Signed)
Pt arrives via [facility] from [facility], ems reports they were called out for "rectal bleeding" after pt was given a suppository and the nurse saw blood on her fingers. Ems report pt had a temp of 102 with them. Per ems, the nursing facility states pts mental status has declined over the past 5 days, pt was recently treated for a UTI.

## 2015-07-31 NOTE — ED Notes (Signed)
EEG at bedside.

## 2015-07-31 NOTE — ED Notes (Signed)
Pt noted to have redness at IV site; IV flushed easily and blood return noted; vancomycin stopped and EDP alerted

## 2015-08-01 DIAGNOSIS — G9340 Encephalopathy, unspecified: Secondary | ICD-10-CM

## 2015-08-01 DIAGNOSIS — E870 Hyperosmolality and hypernatremia: Secondary | ICD-10-CM | POA: Diagnosis not present

## 2015-08-01 DIAGNOSIS — A419 Sepsis, unspecified organism: Secondary | ICD-10-CM | POA: Diagnosis not present

## 2015-08-01 DIAGNOSIS — J690 Pneumonitis due to inhalation of food and vomit: Secondary | ICD-10-CM | POA: Diagnosis not present

## 2015-08-01 LAB — URINE CULTURE: CULTURE: NO GROWTH

## 2015-08-01 LAB — MRSA PCR SCREENING: MRSA by PCR: NEGATIVE

## 2015-08-01 NOTE — Progress Notes (Signed)
Blood culture came back positive for Gram positive cocci in clusters. NP on call made aware.

## 2015-08-01 NOTE — Care Management Obs Status (Signed)
MEDICARE OBSERVATION STATUS NOTIFICATION   Patient Details  Name: Sherry Savage MRN: [id number] Date of Birth: [birth year]   Medicare Observation Status Notification Given:  Yes  Spoke to POA [name] [phone] via phone . Explained Medicare Outpatient Observation Notice , code 45 , answered questions . [name] coming back to visit late this pm . Left hard copy of Observation notice at bedside. [name] RN BSN   Kingsley Plan, RN 08/01/2015, 1:55 PM

## 2015-08-01 NOTE — Progress Notes (Signed)
Nutrition Brief Note  Chart reviewed. Pt now transitioning to comfort care.  No further nutrition interventions warranted at this time.  Please re-consult as needed.   Shruthi Northrup A. Clark Clowdus, RD, LDN, CDE Pager: [phone] After hours Pager: [phone]  

## 2015-08-01 NOTE — Progress Notes (Signed)
PATIENT DETAILS Name: Sherry Savage Age: 82 y.o. Sex: female Date of Birth: 04-May-1916 Admit Date: 07/31/2015 Admitting Physician Ozella Rocks, MD [id number], Newton Pigg, MD  Subjective: Sleeping comfortably-grimaces when I touch her abdomen.  Assessment/Plan: Active Problems: Severe sepsis: Likely secondary to aspiration pneumonia. Currently with comfort care measures-not on any antibiotics.   Presumed aspiration pneumonia: Chest x-ray with right lower lobe infiltrate-given dementia high suspicion for aspiration pneumonia. Comfort care measures in effect,  Not on any antibiotics. No role for speech therapy evaluation in this setting.   Hypernatremia: In a setting of dehydration and sepsis.Comfort care measures in effect-no role in repeating labs  Acute renal failure: Secondary to acute tubular necrosis in a setting of sepsis. Comfort care measures in place-no role for repeating labs.  Acute encephalopathy: Likely metabolic encephalopathy due to severe sepsis, hypernatremia and ARF. CT head negative for acute abnormalities on admission.   ? Abdominal pain: Not sure if patient has a intra-abdominal pathology as well-grimaces to light palpation diffusely-however belly is soft. Patient's HPOA-Ms Thorne-at bedside and understands that further investigations would not likely change management outcome in this setting. Comfort measures in place  Dementia: Currently pleasantly confused, grimaces when abdomen is gently palpated.   Palliative care: Very frail 82 year old skilled nursing facility resident presented with sepsis, profound hypernatremia and acute renal failure probably from aspiration pneumonia. Not sure if she has a intra-abdominal pathology as well as she is tender on exam. suspect patient is appropriate for residential hospice placement, suspect life expectancy is only a few days to a week. Patient's health Of currently, requests that we use Ativan and  morphine very sparingly.  Disposition: Remain inpatient-either inpatient death or residential hospice   Antimicrobial agents  See below  Anti-infectives    Start     Dose/Rate Route Frequency Ordered Stop   08/01/15 1500  ceFEPIme (MAXIPIME) 1 g in dextrose 5 % 50 mL IVPB  Status:  Discontinued     1 g 100 mL/hr over 30 Minutes Intravenous Every 24 hours 07/31/15 1410 07/31/15 1708   07/31/15 2000  piperacillin-tazobactam (ZOSYN) IVPB 2.25 g  Status:  Discontinued     2.25 g 100 mL/hr over 30 Minutes Intravenous Every 8 hours 07/31/15 1135 07/31/15 1409   07/31/15 1345  ceFEPIme (MAXIPIME) 2 g in dextrose 5 % 50 mL IVPB  Status:  Discontinued     2 g 100 mL/hr over 30 Minutes Intravenous  Once 07/31/15 1341 07/31/15 1708   07/31/15 1345  vancomycin (VANCOCIN) IVPB 1000 mg/200 mL premix  Status:  Discontinued     1,000 mg 200 mL/hr over 60 Minutes Intravenous  Once 07/31/15 1341 07/31/15 1430   07/31/15 1000  vancomycin (VANCOCIN) 1,250 mg in sodium chloride 0.9 % 250 mL IVPB     1,250 mg 166.7 mL/hr over 90 Minutes Intravenous  Once 07/31/15 0952 07/31/15 1208   07/31/15 1000  piperacillin-tazobactam (ZOSYN) IVPB 3.375 g     3.375 g 100 mL/hr over 30 Minutes Intravenous  Once 07/31/15 1191 07/31/15 1052      DVT Prophylaxis: None  Code Status:  DNR  Family Communication Faye Thorne-HPOA-at bedside  Procedures: None  CONSULTS:  None  Time spent 35 minutes-Greater than 50% of this time was spent in counseling, explanation of diagnosis, planning of further management, and coordination of care.  MEDICATIONS: Scheduled Meds:  Continuous Infusions: . sodium chloride    . morphine  PRN Meds:.acetaminophen **OR** acetaminophen, antiseptic oral rinse, chlorproMAZINE, diphenhydrAMINE, glycopyrrolate **OR** glycopyrrolate **OR** glycopyrrolate, haloperidol **OR** haloperidol **OR** haloperidol lactate, ipratropium-albuterol, LORazepam, morphine, morphine, morphine  CONCENTRATE **OR** morphine CONCENTRATE, ondansetron **OR** ondansetron (ZOFRAN) IV, oxybutynin, polyvinyl alcohol, temazepam    PHYSICAL EXAM: Vital signs in last 24 hours: Filed Vitals:   07/31/15 1645 07/31/15 1800 07/31/15 2004 08/01/15 0516  BP: 107/43 114/44 115/47 108/44  Pulse: 81  48 52  Temp:   97.7 F (36.5 C) 97.9 F (36.6 C)  TempSrc:   Axillary Axillary  Resp: 32 Height:      Weight:   54.4 kg (119 lb 14.9 oz)   SpO2: 98%  92% 93%    Weight change:  Filed Weights   07/31/15 0911 07/31/15 2004  Weight: 68.04 kg (150 lb) 54.4 kg (119 lb 14.9 oz)   Body mass index is 23.55 kg/(m^2).   Gen Exam: Sleeping comfortably-but grimaces when abdomen is gently palpated Neck: Supple Chest: B/L Clear anteriorly  CVS: S1 S2 Regular Abdomen: soft-but appears to have diffuse tenderness-as grimaces to gentle palpation. Extremities: no edema Neurologic: Non Focal-generalized weakness but seems to move all 4 extremities.  Wounds: N/A.    Intake/Output from previous day:  Intake/Output Summary (Last 24 hours) at 08/01/15 1502 Last data filed at 08/01/15 1353  Gross per 24 hour  Intake      0 ml  Output      0 ml  Net      0 ml     LAB RESULTS: CBC  Recent Labs Lab 07/31/15 1045  WBC 14.8*  HGB 13.1  HCT 43.2  PLT 147*  MCV 96.6  MCH 29.3  MCHC 30.3  RDW 16.0*  LYMPHSABS 2.3  MONOABS 0.8  EOSABS 0.0  BASOSABS 0.0    Chemistries   Recent Labs Lab 07/31/15 0929  NA 179*  K 5.2*  CL >130*  CO2 26  GLUCOSE 95  BUN 70*  CREATININE 3.69*  CALCIUM 9.3    CBG: No results for input(s): GLUCAP in the last 168 hours.  GFR Estimated Creatinine Clearance: 6.7 mL/min (by C-G formula based on Cr of 3.69).  Coagulation profile  Recent Labs Lab 07/31/15 1459  INR 1.59*    Cardiac Enzymes No results for input(s): CKMB, TROPONINI, MYOGLOBIN in the last 168 hours.  Invalid input(s): CK  Invalid input(s): POCBNP No results for  input(s): DDIMER in the last 72 hours. No results for input(s): HGBA1C in the last 72 hours. No results for input(s): CHOL, HDL, LDLCALC, TRIG, CHOLHDL, LDLDIRECT in the last 72 hours. No results for input(s): TSH, T4TOTAL, T3FREE, THYROIDAB in the last 72 hours.  Invalid input(s): FREET3 No results for input(s): VITAMINB12, FOLATE, FERRITIN, TIBC, IRON, RETICCTPCT in the last 72 hours. No results for input(s): LIPASE, AMYLASE in the last 72 hours.  Urine Studies No results for input(s): UHGB, CRYS in the last 72 hours.  Invalid input(s): UACOL, UAPR, USPG, UPH, UTP, UGL, UKET, UBIL, UNIT, UROB, ULEU, UEPI, UWBC, URBC, UBAC, CAST, UCOM, BILUA  MICROBIOLOGY: Recent Results (from the past 240 hour(s))  Blood Culture (routine x 2)     Status: None (Preliminary result)   Collection Time: 07/31/15  9:30 AM  Result Value Ref Range Status   Specimen Description BLOOD RIGHT HAND  Final   Special Requests BOTTLES DRAWN AEROBIC ONLY 2CC  Final   Culture  Setup Time   Final    GRAM POSITIVE COCCI IN CLUSTERS AEROBIC BOTTLE  ONLY CRITICAL RESULT CALLED TO, READ BACK BY AND VERIFIED WITH: J CARSON,RN @0609  08/01/15 MKELLY    Culture NO GROWTH 1 DAY  Final   Report Status PENDING  Incomplete  Blood Culture (routine x 2)     Status: None (Preliminary result)   Collection Time: 07/31/15  9:35 AM  Result Value Ref Range Status   Specimen Description BLOOD LEFT HAND  Final   Special Requests BOTTLES DRAWN AEROBIC ONLY 2CC  Final   Culture NO GROWTH 1 DAY  Final   Report Status PENDING  Incomplete  Urine culture     Status: None   Collection Time: 07/31/15 10:10 AM  Result Value Ref Range Status   Specimen Description URINE, CATHETERIZED  Final   Special Requests NONE  Final   Culture NO GROWTH 1 DAY  Final   Report Status 08/01/2015 FINAL  Final  MRSA PCR Screening     Status: None   Collection Time: 08/01/15  7:36 AM  Result Value Ref Range Status   MRSA by PCR NEGATIVE NEGATIVE Final     Comment:        The GeneXpert MRSA Assay (FDA approved for NASAL specimens only), is one component of a comprehensive MRSA colonization surveillance program. It is not intended to diagnose MRSA infection nor to guide or monitor treatment for MRSA infections.     RADIOLOGY STUDIES/RESULTS: Ct Head Wo Contrast  07/31/2015  CLINICAL DATA:  Pt septic, probably from UTI; decline in mental status x 5 days, febrile now, non-responsive EXAM: CT HEAD WITHOUT CONTRAST TECHNIQUE: Contiguous axial images were obtained from the base of the skull through the vertex without intravenous contrast. COMPARISON:  None. FINDINGS: There is no evidence of mass effect, midline shift, or extra-axial fluid collections. There is no evidence of a space-occupying lesion or intracranial hemorrhage. There is no evidence of a cortical-based area of acute infarction. There is generalized cerebral atrophy. There is periventricular white matter low attenuation likely secondary to microangiopathy. The ventricles and sulci are appropriate for the patient's age. The basal cisterns are patent. Visualized portions of the orbits are unremarkable. The visualized portions of the paranasal sinuses and mastoid air cells are unremarkable. Cerebrovascular atherosclerotic calcifications are noted. The osseous structures are unremarkable. IMPRESSION: 1. No acute intracranial pathology. 2. Chronic microvascular disease and cerebral atrophy. Electronically Signed   By: Elige Ko   On: 07/31/2015 14:02   Dg Chest Portable 1 View  07/31/2015  CLINICAL DATA:  Sepsis EXAM: PORTABLE CHEST 1 VIEW COMPARISON:  Portable exam 0948 hours without priors for comparison. FINDINGS: Upper normal heart size. Mediastinal contours and pulmonary vascularity normal. RIGHT basilar infiltrate likely representing RIGHT lower lobe pneumonia. Minimal central peribronchial thickening. Calcified granuloma lower RIGHT chest. Remaining lungs clear. Atherosclerotic  calcification aorta. No pleural effusion or pneumothorax. Bones demineralized. IMPRESSION: RIGHT basilar infiltrate likely representing RIGHT lower lobe pneumonia. Electronically Signed   By: Ulyses Southward M.D.   On: 07/31/2015 09:56    Jeoffrey Massed, MD  [facility] Pager:[phone]  If 7PM-7AM, please contact night-coverage [email or web address] Password [email or web address] 08/01/2015, 3:02 PM   LOS: 1 day

## 2015-08-01 NOTE — Progress Notes (Signed)
Unable to put foley in. Tried 3x unsuccessful. NP on call made aware. Will pass endorse to day shift.

## 2015-08-02 DIAGNOSIS — G9340 Encephalopathy, unspecified: Secondary | ICD-10-CM | POA: Diagnosis not present

## 2015-08-02 DIAGNOSIS — N171 Acute kidney failure with acute cortical necrosis: Secondary | ICD-10-CM | POA: Diagnosis not present

## 2015-08-02 DIAGNOSIS — A419 Sepsis, unspecified organism: Secondary | ICD-10-CM | POA: Diagnosis not present

## 2015-08-02 DIAGNOSIS — E870 Hyperosmolality and hypernatremia: Secondary | ICD-10-CM | POA: Diagnosis not present

## 2015-08-02 MED ORDER — GLYCOPYRROLATE 1 MG PO TABS: 1.0000 mg | ORAL_TABLET | ORAL | Status: AC | PRN

## 2015-08-02 MED ORDER — TEMAZEPAM 15 MG PO CAPS: 15.0000 mg | ORAL_CAPSULE | Freq: Every evening | ORAL | Status: AC | PRN

## 2015-08-02 MED ORDER — MORPHINE SULFATE (CONCENTRATE) 10 MG/0.5ML PO SOLN: 5.0000 mg | ORAL | Status: AC | PRN

## 2015-08-02 MED ORDER — HALOPERIDOL 0.5 MG PO TABS: 0.5000 mg | ORAL_TABLET | ORAL | Status: AC | PRN

## 2015-08-02 MED ORDER — LORAZEPAM 2 MG/ML PO CONC: 1.0000 mg | ORAL | Status: AC | PRN

## 2015-08-02 MED ORDER — ONDANSETRON 4 MG PO TBDP: 4.0000 mg | ORAL_TABLET | Freq: Four times a day (QID) | ORAL | Status: AC | PRN

## 2015-08-02 NOTE — [place] FL2 (Signed)
[place] MEDICAID FL2 LEVEL OF CARE SCREENING TOOL     IDENTIFICATION  Patient Name: Sherry LeschesHattie Charnley Birthdate: [birth year] Sex: female Admission Date (Current Location): 07/31/2015  [place]:  [place] [id number] Facility and Address:  [facility] [place], [address], [place], [place] [address]      Provider Number: [id number]  Attending Physician Name and Address:  Maretta BeesShanker M Ghimire, MD  Relative Name and Phone Number:  Marta AntuFaye Thorne - POA (friend) [phone]    Current Level of Care: Hospital Recommended Level of Care: Skilled Nursing Facility Prior Approval Number:    Date Approved/Denied:   PASRR Number: [id number] [id number] (Eff. 12/17/06)  Discharge Plan: SNF [facility]    Current Diagnoses: Patient Active Problem List   Diagnosis Date Noted  . Hypernatremia 07/31/2015  . Sepsis (HCC) 07/31/2015  . Elevated blood pressure 07/31/2015  . HCAP (healthcare-associated pneumonia)   . End of life care   . Acute encephalopathy   . Allergic rhinitis 11/02/2012  . Benign renovascular hypertension 11/02/2012  . Alzheimer's disease 11/02/2012  . Chronic kidney disease 11/02/2012    Orientation RESPIRATION BLADDER Height & Weight      (Patient disoriented X4)  Normal Continent Weight: 119 lb 14.9 oz (54.4 kg) Height:  4' 11.84" (152 cm)  BEHAVIORAL SYMPTOMS/MOOD NEUROLOGICAL BOWEL NUTRITION STATUS      Incontinent Diet (Regular diet)  AMBULATORY STATUS COMMUNICATION OF NEEDS Skin   Extensive Assist Verbally Normal                       Personal Care Assistance Level of Assistance  Bathing, Feeding, Dressing Bathing Assistance: Maximum assistance Feeding assistance: Limited assistance Dressing Assistance: Maximum assistance     Functional Limitations Info  Sight, Hearing, Speech Sight Info: Adequate Hearing Info: Adequate Speech Info: Impaired (Speech incomprehensible)    SPECIAL CARE FACTORS FREQUENCY                        Contractures Contractures Info: Not present    Additional Factors Info  Code Status, Allergies Code Status Info: DNR Allergies Info: Cozaar           Current Medications (08/02/2015):  This is the current hospital active medication list Current Facility-Administered Medications  Medication Dose Route Frequency Provider Last Rate Last Dose  . 0.9 %  sodium chloride infusion   Intravenous Continuous Ozella Rocksavid J Merrell, MD 10 mL/hr at 08/02/15 0028    . acetaminophen (TYLENOL) tablet 650 mg  650 mg Oral Q6H PRN Ozella Rocksavid J Merrell, MD       Or  . acetaminophen (TYLENOL) suppository 650 mg  650 mg Rectal Q6H PRN Ozella Rocksavid J Merrell, MD      . antiseptic oral rinse (BIOTENE) solution 15 mL  15 mL Topical PRN Ozella Rocksavid J Merrell, MD      . chlorproMAZINE (THORAZINE) tablet 25 mg  25 mg Oral QID PRN Ozella Rocksavid J Merrell, MD      . diphenhydrAMINE (BENADRYL) injection 12.5 mg  12.5 mg Intravenous Q4H PRN Ozella Rocksavid J Merrell, MD      . glycopyrrolate (ROBINUL) tablet 1 mg  1 mg Oral Q4H PRN Ozella Rocksavid J Merrell, MD       Or  . glycopyrrolate (ROBINUL) injection 0.2 mg  0.2 mg Subcutaneous Q4H PRN Ozella Rocksavid J Merrell, MD       Or  . glycopyrrolate (ROBINUL) injection 0.2 mg  0.2 mg Intravenous Q4H PRN Elmon Elseavid J  Konrad Dolores, MD      . haloperidol (HALDOL) tablet 0.5 mg  0.5 mg Oral Q4H PRN Ozella Rocks, MD       Or  . haloperidol (HALDOL) 2 MG/ML solution 0.5 mg  0.5 mg Sublingual Q4H PRN Ozella Rocks, MD       Or  . haloperidol lactate (HALDOL) injection 0.5 mg  0.5 mg Intravenous Q4H PRN Ozella Rocks, MD      . ipratropium-albuterol (DUONEB) 0.5-2.5 (3) MG/3ML nebulizer solution 3 mL  3 mL Nebulization Q6H PRN Ozella Rocks, MD      . LORazepam (ATIVAN) injection 1 mg  1 mg Intravenous Q4H PRN Ozella Rocks, MD      . morphine bolus via infusion 2 mg  2 mg Intravenous Q15 min PRN Ozella Rocks, MD      . morphine CONCENTRATE 10 MG/0.5ML oral solution 5 mg  5 mg Oral Q2H PRN Ozella Rocks, MD        Or  . morphine CONCENTRATE 10 MG/0.5ML oral solution 5 mg  5 mg Sublingual Q2H PRN Ozella Rocks, MD      . ondansetron (ZOFRAN-ODT) disintegrating tablet 4 mg  4 mg Oral Q6H PRN Ozella Rocks, MD       Or  . ondansetron [facility]) injection 4 mg  4 mg Intravenous Q6H PRN Ozella Rocks, MD      . oxybutynin [facility]) tablet 2.5 mg  2.5 mg Oral QID PRN Ozella Rocks, MD      . polyvinyl alcohol (LIQUIFILM TEARS) 1.4 % ophthalmic solution 1 drop  1 drop Both Eyes QID PRN Ozella Rocks, MD      . temazepam (RESTORIL) capsule 15 mg  15 mg Oral QHS PRN Ozella Rocks, MD         Discharge Medications: Please see discharge summary for a list of discharge medications.  Relevant Imaging Results:  Relevant Lab Results:   Additional Information ss#[id number]  Cristobal Goldmann, LCSW

## 2015-08-02 NOTE — Progress Notes (Signed)
Remains pleasantly confused Continues to grimace when abd is touched Spoke with HPOA-Ms F Thorne at bedside, Goals of care are the following: 1. Discharge back to SNF with hospice/palliative care follow up (Family does not want Residential hospice) 2. DNR-full comfort measures 3. Use morphine and Ativan prn only 4. Ok to resume Dys 1 diet for comfort-family fully aware of aspiration risk and accepting. 5. No further lab work or any other investigations  Will consult SW to see if patient can go back to SNF today

## 2015-08-02 NOTE — Progress Notes (Signed)
[facility] discharge [facility]. VSS. Diacharge paper given to the transport personnel. POA Ms. Jacinto Reaphorne notified of discharge.

## 2015-08-02 NOTE — Clinical Social Work Note (Signed)
Patient medically stable for [facility] today. POA Marta AntuFaye Thorne contacted and informed of discharge and ambulance transport [facility]. Discharge information transmitted to facility.   Genelle BalVanessa Nasiah Lehenbauer, MSW, LCSW Licensed Clinical Social Worker Clinical Social Work Department [facility] [phone]

## 2015-08-02 NOTE — Discharge Summary (Signed)
PATIENT DETAILS Name: Luwanda Starr Age: 82 y.o. Sex: female Date of Birth: 08/15/1915 MRN: [id number]. Admitting Physician: Ozella Rocks, MD WUJ:[id number], Newton Pigg, MD  Admit Date: 07/31/2015 Discharge date: 08/02/2015  Recommendations for Outpatient Follow-up:  1. SNF with hospice/palliative care follow up 2. Goals of care are for comfort.  PRIMARY DISCHARGE DIAGNOSIS:  Active Problems:   Chronic kidney disease   Hypernatremia   Sepsis (HCC)   Elevated blood pressure   End of life care   Acute encephalopathy      PAST MEDICAL HISTORY: Past Medical History  Diagnosis Date  . 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 Alzheimer's disease 11/02/2012  . Allergic rhinitis, cause unspecified 11/02/2012  . Chronic kidney disease, unspecified (HCC) 11/02/2012    DISCHARGE MEDICATIONS: Current Discharge Medication List    START taking these medications   Details  glycopyrrolate (ROBINUL) 1 MG tablet Take 1 tablet (1 mg total) by mouth every 4 (four) hours as needed (excessive secretions).    haloperidol (HALDOL) 0.5 MG tablet Take 1 tablet (0.5 mg total) by mouth every 4 (four) hours as needed for agitation (or delirium). Qty: 30 tablet, Refills: 0    LORazepam (LORAZEPAM INTENSOL) 2 MG/ML concentrated solution Take 0.5 mLs (1 mg total) by mouth every 4 (four) hours as needed for anxiety, seizure or sedation. Qty: 30 mL, Refills: 0    Morphine Sulfate (MORPHINE CONCENTRATE) 10 MG/0.5ML SOLN concentrated solution Take 0.25 mLs (5 mg total) by mouth every 2 (two) hours as needed for moderate pain (or dyspnea). Qty: 30 mL, Refills: 0    ondansetron (ZOFRAN-ODT) 4 MG disintegrating tablet Take 1 tablet (4 mg total) by mouth every 6 (six) hours as needed for nausea or vomiting.    temazepam (RESTORIL) 15 MG capsule Take 1 capsule (15 mg total) by mouth at bedtime as needed for sleep. Qty: 30 capsule, Refills: 0      CONTINUE these medications which have NOT  CHANGED   Details  albuterol (PROVENTIL) (2.5 MG/3ML) 0.083% nebulizer solution Take 2.5 mg by nebulization every 4 (four) hours as needed for wheezing.    lactose free nutrition (BOOST PLUS) LIQD Take 237 mLs by mouth 3 (three) times daily between meals.      STOP taking these medications     acetaminophen (TYLENOL) 120 MG suppository      amLODipine (NORVASC) 5 MG tablet      aspirin 81 MG tablet      cloNIDine (CATAPRES) 0.1 MG tablet      donepezil (ARICEPT ODT) 10 MG disintegrating tablet      loratadine (CLARITIN) 10 MG tablet      LORazepam (ATIVAN) 0.5 MG tablet         ALLERGIES:   Allergies  Allergen Reactions  . Cozaar [Losartan Potassium]   . Vancomycin Other (See Comments)    Redness, swelling at IV site    BRIEF HPI:  See H&P, Labs, Consult and Test reports for all details in brief, Jewel Mcafee is a 82 y.o. female with a Past Medical History of hypertension, dementia, C KD who presents with obtunded state with HCAP, and severe hypernatremia.   CONSULTATIONS:   neurology  PERTINENT RADIOLOGIC STUDIES: Ct Head Wo Contrast  07/31/2015  CLINICAL DATA:  Pt septic, probably from UTI; decline in mental status x 5 days, febrile now, non-responsive EXAM: CT HEAD WITHOUT CONTRAST TECHNIQUE: Contiguous axial images were obtained from the base of the skull through the vertex without intravenous contrast. COMPARISON:  None. FINDINGS: There is no evidence of mass effect, midline shift, or extra-axial fluid collections. There is no evidence of a space-occupying lesion or intracranial hemorrhage. There is no evidence of a cortical-based area of acute infarction. There is generalized cerebral atrophy. There is periventricular white matter low attenuation likely secondary to microangiopathy. The ventricles and sulci are appropriate for the patient's age. The basal cisterns are patent. Visualized portions of the orbits are unremarkable. The visualized portions of the  paranasal sinuses and mastoid air cells are unremarkable. Cerebrovascular atherosclerotic calcifications are noted. The osseous structures are unremarkable. IMPRESSION: 1. No acute intracranial pathology. 2. Chronic microvascular disease and cerebral atrophy. Electronically Signed   By: Elige Ko   On: 07/31/2015 14:02   Dg Chest Portable 1 View  07/31/2015  CLINICAL DATA:  Sepsis EXAM: PORTABLE CHEST 1 VIEW COMPARISON:  Portable exam 0948 hours without priors for comparison. FINDINGS: Upper normal heart size. Mediastinal contours and pulmonary vascularity normal. RIGHT basilar infiltrate likely representing RIGHT lower lobe pneumonia. Minimal central peribronchial thickening. Calcified granuloma lower RIGHT chest. Remaining lungs clear. Atherosclerotic calcification aorta. No pleural effusion or pneumothorax. Bones demineralized. IMPRESSION: RIGHT basilar infiltrate likely representing RIGHT lower lobe pneumonia. Electronically Signed   By: Ulyses Southward M.D.   On: 07/31/2015 09:56     PERTINENT LAB RESULTS: CBC:  Recent Labs  07/31/15 1045  WBC 14.8*  HGB 13.1  HCT 43.2  PLT 147*   CMET CMP     Component Value Date/Time   NA 179* 07/31/2015 0929   K 5.2* 07/31/2015 0929   CL >130* 07/31/2015 0929   CO2 26 07/31/2015 0929   GLUCOSE 95 07/31/2015 0929   BUN 70* 07/31/2015 0929   CREATININE 3.69* 07/31/2015 0929   CALCIUM 9.3 07/31/2015 0929   PROT 7.1 07/31/2015 0929   ALBUMIN 2.6* 07/31/2015 0929   AST 35 07/31/2015 0929   ALT 27 07/31/2015 0929   ALKPHOS 57 07/31/2015 0929   BILITOT 0.6 07/31/2015 0929   GFRNONAA 9* 07/31/2015 0929   GFRAA 11* 07/31/2015 0929    GFR Estimated Creatinine Clearance: 6.7 mL/min (by C-G formula based on Cr of 3.69). No results for input(s): LIPASE, AMYLASE in the last 72 hours. No results for input(s): CKTOTAL, CKMB, CKMBINDEX, TROPONINI in the last 72 hours. Invalid input(s): POCBNP No results for input(s): DDIMER in the last 72 hours. No  results for input(s): HGBA1C in the last 72 hours. No results for input(s): CHOL, HDL, LDLCALC, TRIG, CHOLHDL, LDLDIRECT in the last 72 hours. No results for input(s): TSH, T4TOTAL, T3FREE, THYROIDAB in the last 72 hours.  Invalid input(s): FREET3 No results for input(s): VITAMINB12, FOLATE, FERRITIN, TIBC, IRON, RETICCTPCT in the last 72 hours. Coags:  Recent Labs  07/31/15 1459  INR 1.59*   Microbiology: Recent Results (from the past 240 hour(s))  Blood Culture (routine x 2)     Status: None (Preliminary result)   Collection Time: 07/31/15  9:30 AM  Result Value Ref Range Status   Specimen Description BLOOD RIGHT HAND  Final   Special Requests BOTTLES DRAWN AEROBIC ONLY 2CC  Final   Culture  Setup Time   Final    GRAM POSITIVE COCCI IN CLUSTERS AEROBIC BOTTLE ONLY CRITICAL RESULT CALLED TO, READ BACK BY AND VERIFIED WITH: J CARSON,RN  08/01/15 MKELLY    Culture GRAM POSITIVE COCCI  Final   Report Status PENDING  Incomplete  Blood Culture (routine x 2)     Status: None (Preliminary result)  Collection Time: 07/31/15  9:35 AM  Result Value Ref Range Status   Specimen Description BLOOD LEFT HAND  Final   Special Requests BOTTLES DRAWN AEROBIC ONLY 2CC  Final   Culture NO GROWTH 2 DAYS  Final   Report Status PENDING  Incomplete  Urine culture     Status: None   Collection Time: 07/31/15 10:10 AM  Result Value Ref Range Status   Specimen Description URINE, CATHETERIZED  Final   Special Requests NONE  Final   Culture NO GROWTH 1 DAY  Final   Report Status 08/01/2015 FINAL  Final  MRSA PCR Screening     Status: None   Collection Time: 08/01/15  7:36 AM  Result Value Ref Range Status   MRSA by PCR NEGATIVE NEGATIVE Final    Comment:        The GeneXpert MRSA Assay (FDA approved for NASAL specimens only), is one component of a comprehensive MRSA colonization surveillance program. It is not intended to diagnose MRSA infection nor to guide or monitor treatment  for MRSA infections.      BRIEF HOSPITAL COURSE:  Severe sepsis: Likely secondary to aspiration pneumonia. Currently with comfort care measures-not on any antibiotics.   Presumed aspiration pneumonia: Chest x-ray with right lower lobe infiltrate-given dementia high suspicion for aspiration pneumonia. Comfort care measures in effect, Not on any antibiotics. No role for speech therapy evaluation in this setting. HPOA-Ms Malon KindleFaye Thorne-ok to start comfort feedings with a Dysphagia 1 diet (per Ms Terie Purserhorne-patient was on a puree diet prior to this hospitalization). HPOA is accepting all aspiration risk  Hypernatremia: In a setting of dehydration and sepsis.Comfort care measures in effect-no role in repeating labs  Acute renal failure: Secondary to acute tubular necrosis in a setting of sepsis. Comfort care measures in place-no role for repeating labs.  Acute encephalopathy: Likely metabolic encephalopathy due to severe sepsis, hypernatremia and ARF. CT head negative for acute abnormalities on admission. Neurology was consulted during this hospital stay-no further recommendations  ? Abdominal pain: Not sure if patient has a intra-abdominal pathology as well-grimaces to light palpation diffusely-however belly is soft.Given advanced dementia-very difficult exam. Patient's HPOA-Ms Thorne-was at bedside and understands that further investigations would not likely change management outcome in this setting. Comfort measures in place  Dementia: Currently pleasantly confused, grimaces when abdomen is gently palpated.   Palliative care: Very frail 82 year old skilled nursing facility resident presented with sepsis, profound hypernatremia and acute renal failure probably from aspiration pneumonia. Not sure if she has a intra-abdominal pathology as well as she is tender on exam. suspect patient is appropriate for residential hospice placement, suspect life expectancy is only a few days to a week. However patients  HPOA is not interested in Residential Hospice placement-and would rather have patient go back to SNF with hospice/palliative care follow up. Patient's HPOA also requests that we use Ativan and morphine on a prn basis only.Suspect life expectancy to be a few days to a few weeks.  On 08/02/15 after d/w HPOA at bedside-the following were goals of care:  1. Discharge back to SNF with hospice/palliative care follow up (Family does not want Residential hospice) 2. DNR-full comfort measures 3. Use morphine and Ativan prn only 4. Ok to resume Dys 1 diet for comfort-family fully aware of aspiration risk and accepting. No role for artificial feeding in this setting 5. No further lab work or any other investigations  TODAY-DAY OF DISCHARGE:  Subjective:   Darla LeschesHattie Wimberley today continues to be confused-continues  to grimace with gentle palpation to her abdomen.  Objective:   Blood pressure 118/52, pulse 64, temperature 97.5 F (36.4 C), temperature source Axillary, resp. rate 18, height 4' 11.84" (1.52 m), weight 54.4 kg (119 lb 14.9 oz), SpO2 90 %.  Intake/Output Summary (Last 24 hours) at 08/02/15 1328 Last data filed at 08/02/15 0900  Gross per 24 hour  Intake      0 ml  Output      0 ml  Net      0 ml   Filed Weights   07/31/15 0911 07/31/15 2004  Weight: 68.04 kg (150 lb) 54.4 kg (119 lb 14.9 oz)    Exam Awake but confused. Does not appear to be in any distress.  Supple Neck Symmetrical Chest wall movement, Good air movement bilaterally RRR, +ve B.Sounds, Abd Soft, and appears to be diffusely tender-but No rebound -guarding or rigidity. No Cyanosis  DISCHARGE CONDITION: Stable-but with poor overall prognoses  DISPOSITION: SNF-with hospice/palliative care following  DISCHARGE INSTRUCTIONS:    Activity:  As tolerated with Full fall precautions use walker/cane & assistance as needed  Get Medicines reviewed and adjusted: Please take all your medications with you for your next  visit with your Primary MD  Please request your Primary MD to go over all hospital tests and procedure/radiological results at the follow up, please ask your Primary MD to get all Hospital records sent to his/her office.  If you experience worsening of your admission symptoms, develop shortness of breath, life threatening emergency, suicidal or homicidal thoughts you must seek medical attention immediately by calling 911 or calling your MD immediately  if symptoms less severe.  You must read complete instructions/literature along with all the possible adverse reactions/side effects for all the Medicines you take and that have been prescribed to you. Take any new Medicines after you have completely understood and accpet all the possible adverse reactions/side effects.   Do not drive when taking Pain medications.   Do not take more than prescribed Pain, Sleep and Anxiety Medications  Special Instructions: If you have smoked or chewed Tobacco  in the last 2 yrs please stop smoking, stop any regular Alcohol  and or any Recreational drug use.  Wear Seat belts while driving.  Please note  You were cared for by a hospitalist during your hospital stay. Once you are discharged, your primary care physician will handle any further medical issues. Please note that NO REFILLS for any discharge medications will be authorized once you are discharged, as it is imperative that you return to your primary care physician (or establish a relationship with a primary care physician if you do not have one) for your aftercare needs so that they can reassess your need for medications and monitor your lab values.   Diet recommendation: Dysphagia 1 diet for comfort-with standard aspiration precautions.   Discharge Instructions    Diet general    Complete by:  As directed   Dysphagia 1 diet for comfort     Increase activity slowly    Complete by:  As directed            Follow-up Information    Please follow  up.   Why:  As needed   Contact information:   Primary MD     Total Time spent on discharge equals  45 minutes.  SignedJeoffrey Massed 08/02/2015 1:28 PM

## 2015-08-02 NOTE — Progress Notes (Signed)
[facility], report was given to nurse Loann QuillAlina Muhammad, patient awaiting transport.

## 2015-08-02 NOTE — Clinical Social Work Note (Signed)
Clinical Social Work Assessment  Patient Details  Name: Sherry LeschesHattie Parthasarathy MRN: [id number] Date of Birth: Jan 16, 1916  Date of referral:  08/02/15               Reason for consult:  Facility Placement                Permission sought to share information with:  Facility Medical sales representativeContact Representative (Patient disoriented x4, CSW talked with POA Marta AntuFaye Thorne) Permission granted to share information::  Yes, Verbal Permission Granted (By POA Marta AntuFaye Thorne)  Name::     Marta AntuFaye Thorne  Agency::     Relationship::  POA  Contact Information:  [phone]  Housing/Transportation Living arrangements for the past 2 months:  Skilled Nursing Facility ([facility]) Source of Information:  Power of [place] (Also reviewed chart) Patient Interpreter Needed:  None Criminal Activity/Legal Involvement Pertinent to Current Situation/Hospitalization:  No - Comment as needed Significant Relationships:  Other(Comment), Friend (Friend and POA Marta AntuFaye Thorne) Lives with:    Do you feel safe going back to the place where you live?  Yes Need for family participation in patient care:  Yes (Comment)  Care giving concerns:  None noted by POA. Patient from a skilled facility.   Social Worker assessment / plan:  [facility].  It was felt by medical staff that patient is appropriate for hospice and this was discussed with POA, however she requested [facility].  CSW talked with Ms. Jacinto Reaphorne and she confirmed d/c back to [facility] with Palliative Services.   Employment status:  Retired Health and safety inspectornsurance information:  Armed forces operational officerMedicare, Medicaid [place] Recommend[place]:  Not assessed at this time Information / Referral to community resources:  Other (Comment Required) (Resource information not needed or requested at this time)  Patient/Family's Response to care:  No concerns expressed by POA regarding care. She disagreed with d/c plan of residential hospice.  Patient/Family's  Understanding of and Emotional Response to Diagnosis, Current Treatment, and Prognosis:  Ms. Jacinto Reaphorne aware of patient's condition, however she wishes for patient to return to the skilled facility.   Emotional Assessment Appearance:  Appears stated age Attitude/Demeanor/Rapport:  Unable to Assess Affect (typically observed):  Unable to Assess Orientation:   (Patient disoriented X4) Alcohol / Substance use:  Never Used Psych involvement (Current and /or in the community):  No (Comment)  Discharge Needs  Concerns to be addressed:  Discharge Planning Concerns Readmission within the last 30 days:  No Current discharge risk:  None Barriers to Discharge:  No Barriers Identified   Cristobal GoldmannCrawford, Lilas Diefendorf Bradley, LCSW 08/02/2015, 6:20 PM

## 2015-08-03 LAB — CULTURE, BLOOD (ROUTINE X 2)

## 2015-08-05 LAB — CULTURE, BLOOD (ROUTINE X 2): Culture: NO GROWTH

## 2015-08-31 DEATH — deceased

## 2016-08-06 IMAGING — CT CT HEAD W/O CM
1 series · 15 of 28 positions shown, 19 images · non-contrast
Comparison: None.

CLINICAL DATA: Pt septic, probably from UTI; decline in mental
status x 5 days, febrile now, non-responsive

EXAM:
CT HEAD WITHOUT CONTRAST
TECHNIQUE: Contiguous axial images were obtained from the base of the skull
through the vertex without intravenous contrast.

[Series 2: head 5.0 h31s · axial · 0.45mm/px · z∈[-114,+11]mm · 15 of 28 slices shown, 19 images]
[im 2/28  brain]
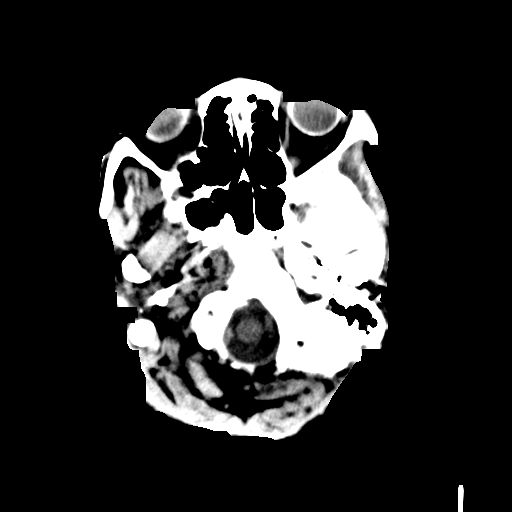
[im 2/28  bone]
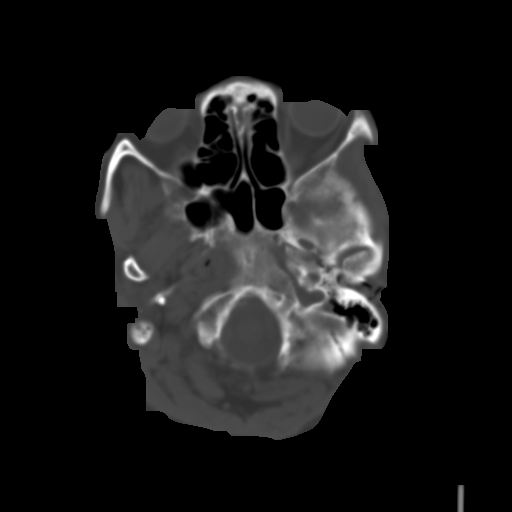
[im 4/28  brain]
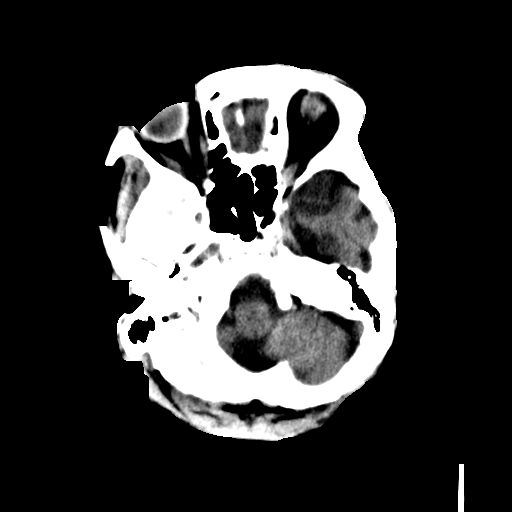
[im 6/28  brain]
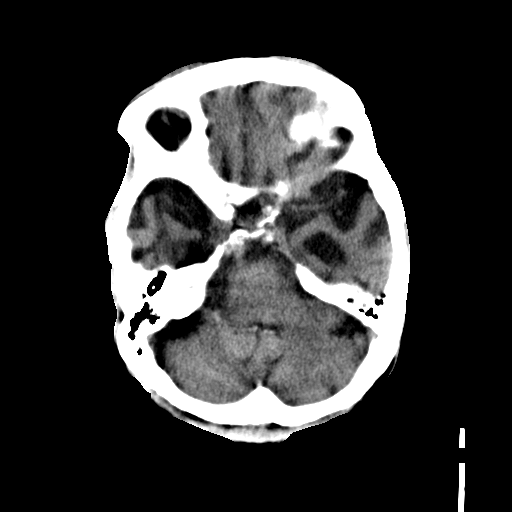
[im 8/28  brain]
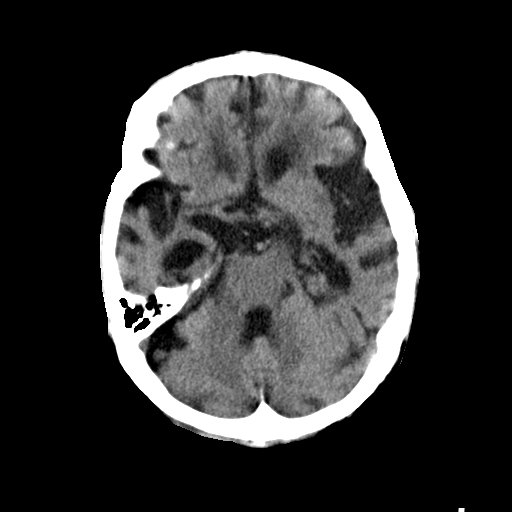
[im 9/28  brain]
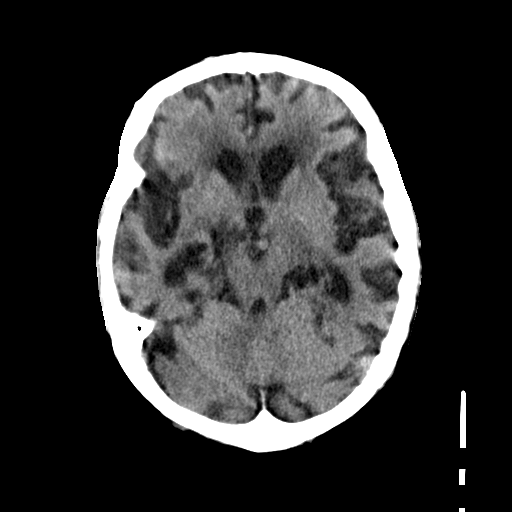
[im 9/28  bone]
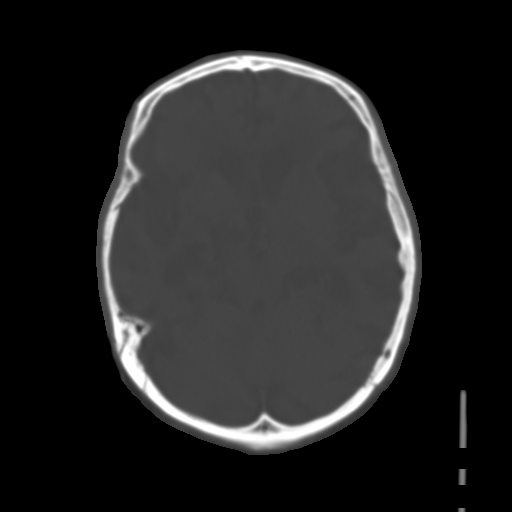
[im 11/28  brain]
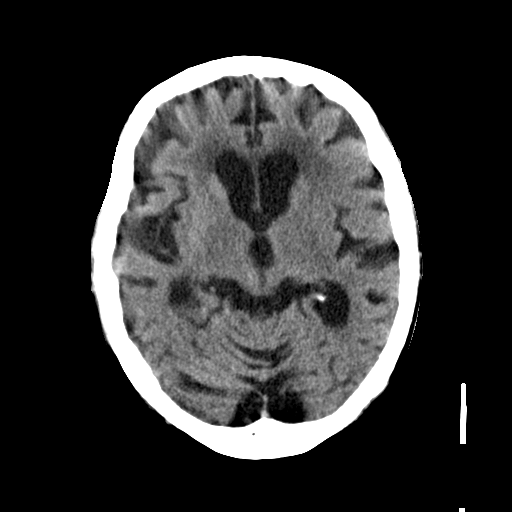
[im 13/28  brain]
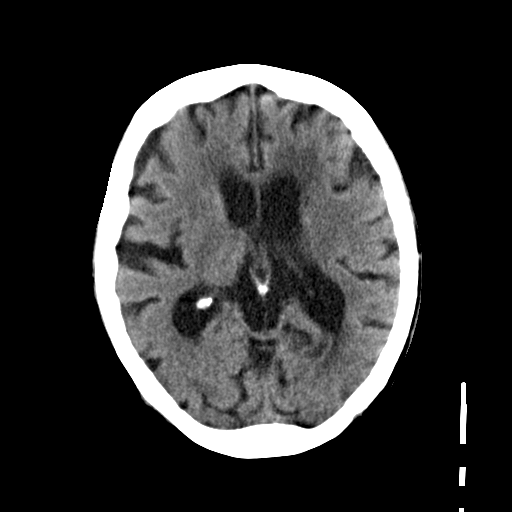
[im 15/28  brain]
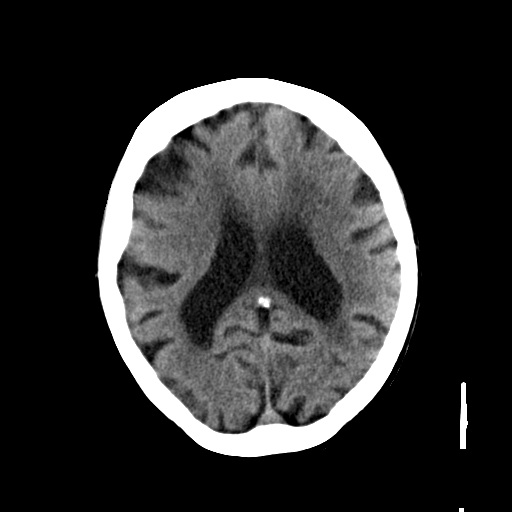
[im 16/28  brain]
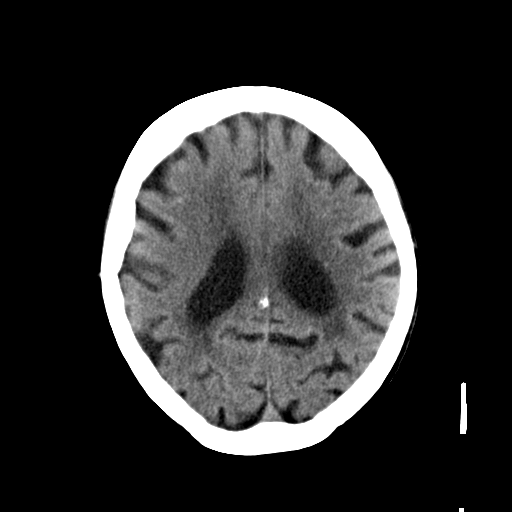
[im 16/28  bone]
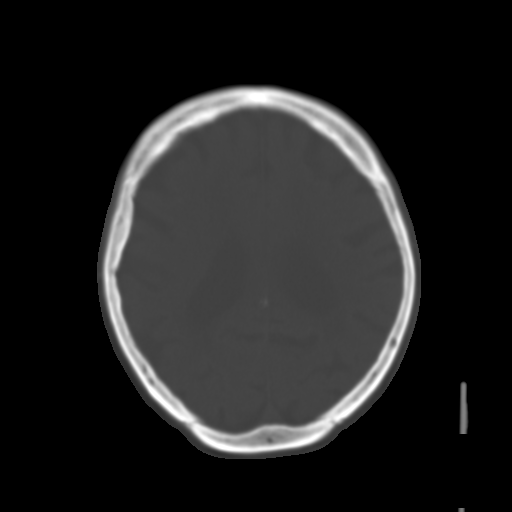
[im 18/28  brain]
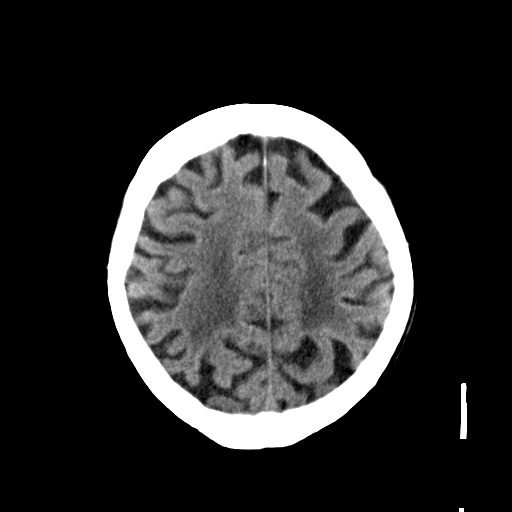
[im 20/28  brain]
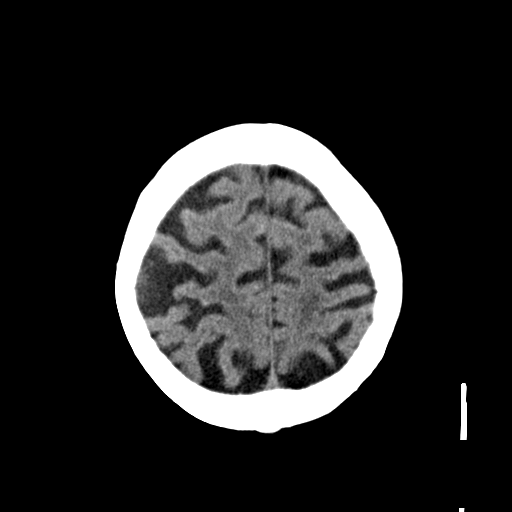
[im 21/28  brain]
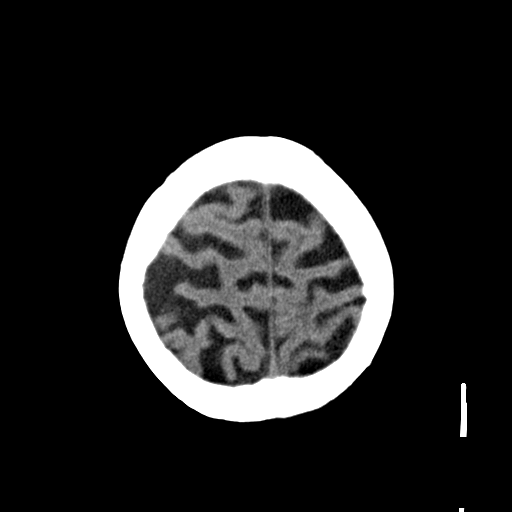
[im 23/28  brain]
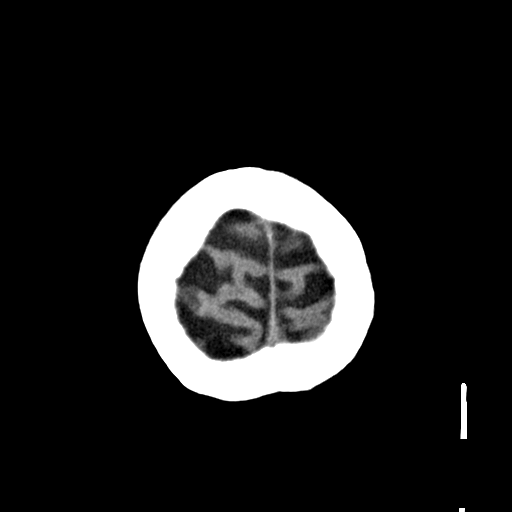
[im 23/28  bone]
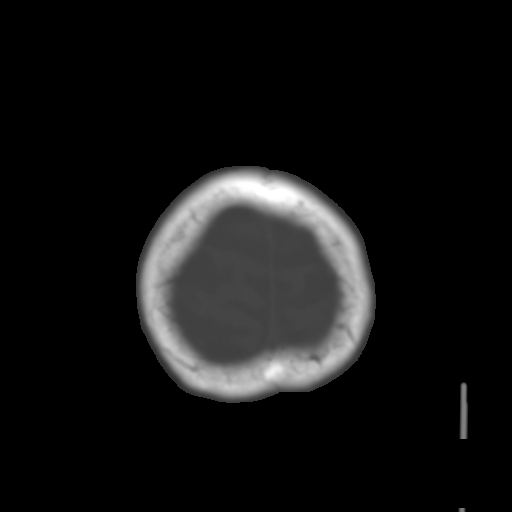
[im 25/28  brain]
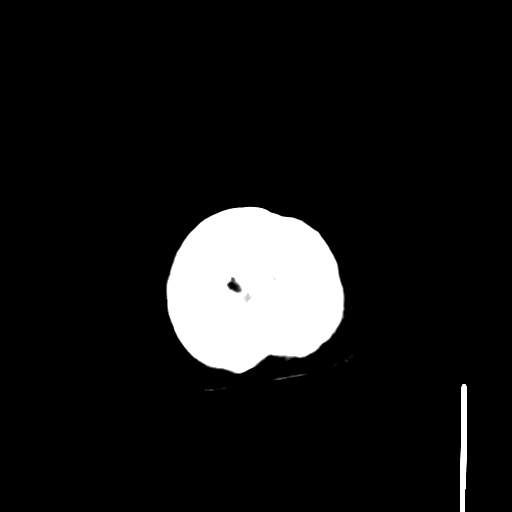
[im 27/28  brain]
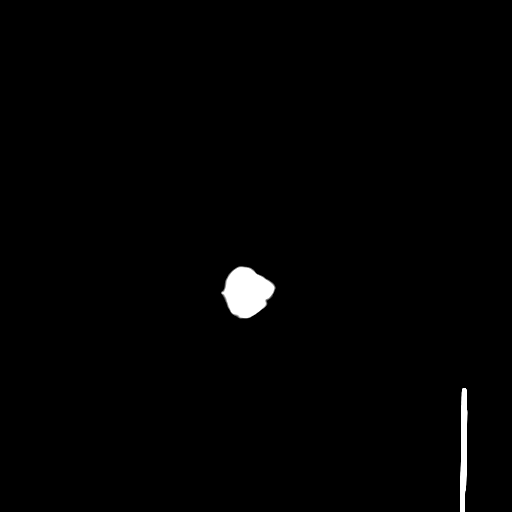

[15 of 28 positions shown; findings below may reference images not displayed]

FINDINGS: There is no evidence of mass effect, midline shift, or extra-axial
fluid collections. There is no evidence of a space-occupying lesion
or intracranial hemorrhage. There is no evidence of a cortical-based
area of acute infarction. There is generalized cerebral atrophy.
There is periventricular white matter low attenuation likely
secondary to microangiopathy.

The ventricles and sulci are appropriate for the patient's age. The
basal cisterns are patent.

Visualized portions of the orbits are unremarkable. The visualized
portions of the paranasal sinuses and mastoid air cells are
unremarkable. Cerebrovascular atherosclerotic calcifications are
noted.

The osseous structures are unremarkable.
IMPRESSION: 1. No acute intracranial pathology.
2. Chronic microvascular disease and cerebral atrophy.
